# Patient Record
Sex: Male | Born: 1965 | Race: Black or African American | Hispanic: No | Marital: Married | Smoking: Former smoker
Health system: Southern US, Community
[De-identification: ages and names within clinical notes are randomized; demographics above are authoritative.]

## PROBLEM LIST (undated history)

## (undated) DIAGNOSIS — R319 Hematuria, unspecified: Secondary | ICD-10-CM

## (undated) DIAGNOSIS — T7840XA Allergy, unspecified, initial encounter: Secondary | ICD-10-CM

## (undated) DIAGNOSIS — J302 Other seasonal allergic rhinitis: Secondary | ICD-10-CM

## (undated) DIAGNOSIS — J45909 Unspecified asthma, uncomplicated: Secondary | ICD-10-CM

## (undated) HISTORY — PX: WISDOM TOOTH EXTRACTION: SHX21

## (undated) HISTORY — PX: TONSILLECTOMY: SUR1361

## (undated) HISTORY — PX: NASAL SEPTUM SURGERY: SHX37

## (undated) HISTORY — DX: Allergy, unspecified, initial encounter: T78.40XA

## (undated) HISTORY — DX: Other seasonal allergic rhinitis: J30.2

## (undated) HISTORY — DX: Hematuria, unspecified: R31.9

## (undated) HISTORY — DX: Unspecified asthma, uncomplicated: J45.909

---

## 2012-03-25 ENCOUNTER — Ambulatory Visit (INDEPENDENT_AMBULATORY_CARE_PROVIDER_SITE_OTHER): Payer: BC Managed Care – PPO | Admitting: Internal Medicine

## 2012-03-25 VITALS — BP 134/77 | HR 64 | Temp 97.9°F | Resp 16 | Ht 68.18 in | Wt 186.4 lb

## 2012-03-25 DIAGNOSIS — N529 Male erectile dysfunction, unspecified: Secondary | ICD-10-CM

## 2012-03-25 DIAGNOSIS — R361 Hematospermia: Secondary | ICD-10-CM

## 2012-03-25 LAB — POCT CBC
Lymph, poc: 3.1 (ref 0.6–3.4)
MCH, POC: 30.9 pg (ref 27–31.2)
MCHC: 32 g/dL (ref 31.8–35.4)
MID (cbc): 0.7 (ref 0–0.9)
MPV: 8.2 fL (ref 0–99.8)
POC LYMPH PERCENT: 39.3 %L (ref 10–50)
POC MID %: 8.3 %M (ref 0–12)
Platelet Count, POC: 404 10*3/uL (ref 142–424)
WBC: 7.9 10*3/uL (ref 4.6–10.2)

## 2012-03-25 LAB — POCT URINALYSIS DIPSTICK
Blood, UA: NEGATIVE
Nitrite, UA: NEGATIVE
Urobilinogen, UA: 0.2
pH, UA: 6

## 2012-03-25 LAB — POCT UA - MICROSCOPIC ONLY
Casts, Ur, LPF, POC: NEGATIVE
Crystals, Ur, HPF, POC: NEGATIVE
Epithelial cells, urine per micros: NEGATIVE

## 2012-03-25 NOTE — Progress Notes (Signed)
  Subjective:    Patient ID: Michael Lewis, male    DOB: [birth year], 46 y.o.   MRN: [id number]  HPI wife noticed blood in semen after intercourse this weekend  He denies dysuria, discharge, testicular pain, urinary hesitation, incomplete voiding, history of prostate problems, urinary frequency, or nocturia/wife with no gyn complaints-neither w/ outside partners  He has noticed 12-18 months history of poor erectile function-erratic-not everytime and no definite associations/admits busy sched-success w/P  Past medical history-no illnesses medications or surgery  Review of Systems No weight loss /no fatigue or change in daily activities No fever chills or night sweats  No headaches or vision trouble  No chest pain or palpitations  No shortness of breath     Objective:   Physical Exam Vital signs stable No thyromegaly or cervical adenopathy Heart regular Testicles without masses or tenderness and no atrophy noted penis intact without lesions Prostate soft and symmetrical without nodules      Results for orders placed in visit on 03/25/12  POCT CBC      Component Value Range   WBC 7.9  4.6 - 10.2 K/uL   Lymph, poc 3.1  0.6 - 3.4   POC LYMPH PERCENT 39.3  10 - 50 %L   MID (cbc) 0.7  0 - 0.9   POC MID % 8.3  0 - 12 %M   POC Granulocyte 4.1  2 - 6.9   Granulocyte percent 52.4  37 - 80 %G   RBC 4.89  4.69 - 6.13 M/uL   Hemoglobin 15.1  14.1 - 18.1 g/dL   HCT, POC 40.9  81.1 - 53.7 %   MCV 96.6  80 - 97 fL   MCH, POC 30.9  27 - 31.2 pg   MCHC 32.0  31.8 - 35.4 g/dL   RDW, POC 91.4     Platelet Count, POC 404  142 - 424 K/uL   MPV 8.2  0 - 99.8 fL  POCT URINALYSIS DIPSTICK      Component Value Range   Color, UA yellow     Clarity, UA clear     Glucose, UA neg     Bilirubin, UA neg     Ketones, UA trace     Spec Grav, UA >=1.030     Blood, UA neg     pH, UA 6.0     Protein, UA neg     Urobilinogen, UA 0.2     Nitrite, UA neg     Leukocytes, UA Negative    POCT UA -  MICROSCOPIC ONLY      Component Value Range   WBC, Ur, HPF, POC 0-3     RBC, urine, microscopic neg     Bacteria, U Microscopic trace     Mucus, UA trace     Epithelial cells, urine per micros neg     Crystals, Ur, HPF, POC neg     Casts, Ur, LPF, POC neg     Yeast, UA neg      Assessment and plan  Problem #1 hematospermia  Problem #2 erectile dysfunction   Routine labs to include URiprobe and testosterone /TSH  If hematospermia continues with no etiology then urological evaluation with cystoscopy will be necessary  We'll notify lab results and plan

## 2012-03-26 LAB — COMPREHENSIVE METABOLIC PANEL
ALT: 19 U/L (ref 0–53)
BUN: 14 mg/dL (ref 6–23)
CO2: 23 mEq/L (ref 19–32)
Creat: 1.02 mg/dL (ref 0.50–1.35)
Total Bilirubin: 0.5 mg/dL (ref 0.3–1.2)

## 2012-03-26 LAB — TSH: TSH: 2.31 u[IU]/mL (ref 0.350–4.500)

## 2012-03-27 LAB — GC/CHLAMYDIA PROBE AMP, URINE
Chlamydia, Swab/Urine, PCR: NEGATIVE
GC Probe Amp, Urine: NEGATIVE

## 2012-03-27 LAB — TESTOSTERONE, FREE, TOTAL, SHBG
Sex Hormone Binding: 29 nmol/L (ref 13–71)
Testosterone, Free: 49.1 pg/mL (ref 47.0–244.0)
Testosterone-% Free: 2.1 % (ref 1.6–2.9)

## 2012-03-29 ENCOUNTER — Encounter: Payer: Self-pay | Admitting: Internal Medicine

## 2012-04-01 ENCOUNTER — Telehealth: Payer: Self-pay

## 2012-04-01 NOTE — Telephone Encounter (Signed)
MELANIE STATES WE TOLD HER HUSBAND WAS TOLD WE WOULD CALL HIM BACK IN 48HRS. REGARDING HIS LABS AND WE WERE ALSO GOING TO MAIL A COPY TO HIS HOME PLEASE CALL [phone]

## 2012-04-01 NOTE — Telephone Encounter (Signed)
Please advise on labs, Dr Merla Riches out of office, testosterone is low, others okay, what should I advise patient?  Note from Dr Merla Riches Routine labs to include URiprobe and testosterone /TSH  If hematospermia continues with no etiology then urological evaluation with cystoscopy will be necessary  We'll notify lab results and plan

## 2012-04-01 NOTE — Telephone Encounter (Signed)
Dr. Merla Riches will be back on Thursday.  I think it is ok if we wait and let him make this decision - unless the patient would like [place] to do a referral to urology (we will definitely want to do referral if he is continuing to have blood in his semen)

## 2012-04-04 ENCOUNTER — Telehealth: Payer: Self-pay

## 2012-04-04 NOTE — Telephone Encounter (Signed)
Patient's wife just received lab results for her husband in the mail and would like someone to call her and go over them with her.  Best # [phone]

## 2012-04-04 NOTE — Telephone Encounter (Signed)
Letter sent 12/20 with all test results No cause of hematospermia identified--if it happens again he needs urological evaluation Also testosterone was low--this also would be a reason to refer him to urology or f/u to consider other options

## 2012-04-04 NOTE — Telephone Encounter (Signed)
PT'S WIFE CALLED AGAIN.  SAYS THEY STILL HAVE NOT RECEIVED THE RESULTS FROM [facility].  ADVISED THE PATIENT HE WOULD BE BACK THIS EVENING.  PLEASE CALL

## 2012-04-05 ENCOUNTER — Ambulatory Visit (INDEPENDENT_AMBULATORY_CARE_PROVIDER_SITE_OTHER): Payer: BC Managed Care – PPO | Admitting: Emergency Medicine

## 2012-04-05 VITALS — BP 113/71 | HR 60 | Temp 98.0°F | Resp 16 | Ht 69.5 in | Wt 182.2 lb

## 2012-04-05 DIAGNOSIS — J209 Acute bronchitis, unspecified: Secondary | ICD-10-CM

## 2012-04-05 DIAGNOSIS — R05 Cough: Secondary | ICD-10-CM

## 2012-04-05 DIAGNOSIS — R059 Cough, unspecified: Secondary | ICD-10-CM

## 2012-04-05 MED ORDER — HYDROCOD POLST-CHLORPHEN POLST 10-8 MG/5ML PO LQCR: 5.0000 mL | Freq: Two times a day (BID) | ORAL | Status: DC | PRN

## 2012-04-05 MED ORDER — AZITHROMYCIN 250 MG PO TABS: ORAL_TABLET | ORAL | Status: DC

## 2012-04-05 NOTE — Telephone Encounter (Signed)
I spoke to his wife, she states he is coming in today for this.

## 2012-04-05 NOTE — Telephone Encounter (Signed)
LMOM for wife to CB. Pt was in to see Dr Dareen Piano for bronchitis this morning and may have had his ?s answered.

## 2012-04-05 NOTE — Progress Notes (Signed)
[facility] [address] [place] [address] [phone]  Date:  04/05/2012   Name:  Michael Lewis   DOB:  06-04-1965   MRN:  [id number]  PCP:  No primary provider on file.    Chief Complaint: Cough and Headache   History of Present Illness:  Michael Lewis is a 45 y.o. very pleasant male patient who presents with the following:  Ill since Monday with fever and chills.  Has a cough that is not productive with some wheezing.  History of asthma treated with MDI as a child.  No shortness of breath, nausea or vomiting.  No stool change.  No improvement with OTC meds. Had a flu shot.  Reformed smoker.  Has a sore throat.  No nasal congestion or drainage.  There is no problem list on file for this patient.   Past Medical History  Diagnosis Date  . Asthma     Past Surgical History  Procedure Date  . Nasal septum surgery     History  Substance Use Topics  . Smoking status: Never Smoker   . Smokeless tobacco: Not on file  . Alcohol Use: No    Family History  Problem Relation Age of Onset  . Alzheimer's disease Maternal Grandfather   . Arthritis Paternal Grandmother     No Known Allergies  Medication list has been reviewed and updated.  No current outpatient prescriptions on file prior to visit.    Review of Systems:  As per HPI, otherwise negative.    Physical Examination: Filed Vitals:   04/05/12 0906  BP: 113/71  Pulse: 60  Temp: 98 F (36.7 C)  Resp: 16   Filed Vitals:   04/05/12 0906  Height: 5' 9.5" (1.765 m)  Weight: 182 lb 3.2 oz (82.645 kg)   Body mass index is 26.52 kg/(m^2). Ideal Body Weight: Weight in (lb) to have BMI = 25: 171.4   GEN: WDWN, NAD, Non-toxic, A & O x 3 HEENT: Atraumatic, Normocephalic. Neck supple. No masses, No LAD. Ears and Nose: No external deformity. CV: RRR, No M/G/R. No JVD. No thrill. No extra heart sounds. PULM: CTA B, no wheezes, crackles, rhonchi. No retractions. No resp. distress. No  accessory muscle use. ABD: S, NT, ND, +BS. No rebound. No HSM. EXTR: No c/c/e NEURO Normal gait.  PSYCH: Normally interactive. Conversant. Not depressed or anxious appearing.  Calm demeanor.    Assessment and Plan: Acute bronchitis zpak tussionex Follow up as needed   Carmelina Dane, MD  Results for orders placed in visit on 04/05/12  POCT INFLUENZA A/B      Component Value Range   Influenza A, POC Negative     Influenza B, POC Negative

## 2012-04-05 NOTE — Patient Instructions (Addendum)

## 2012-04-06 NOTE — Telephone Encounter (Signed)
Patient has been seen in office

## 2013-04-04 ENCOUNTER — Telehealth: Payer: Self-pay

## 2013-04-04 NOTE — Telephone Encounter (Signed)
Incorrect contact information 

## 2013-04-08 ENCOUNTER — Encounter: Payer: Self-pay | Admitting: Family Medicine

## 2013-04-08 ENCOUNTER — Ambulatory Visit (INDEPENDENT_AMBULATORY_CARE_PROVIDER_SITE_OTHER): Payer: BC Managed Care – PPO | Admitting: Family Medicine

## 2013-04-08 VITALS — BP 118/72 | HR 64 | Temp 98.1°F | Ht 69.0 in | Wt 184.0 lb

## 2013-04-08 DIAGNOSIS — J45909 Unspecified asthma, uncomplicated: Secondary | ICD-10-CM

## 2013-04-08 DIAGNOSIS — Z0000 Encounter for general adult medical examination without abnormal findings: Secondary | ICD-10-CM

## 2013-04-08 DIAGNOSIS — Z23 Encounter for immunization: Secondary | ICD-10-CM

## 2013-04-08 LAB — HEPATIC FUNCTION PANEL
AST: 27 U/L (ref 0–37)
Albumin: 4 g/dL (ref 3.5–5.2)
Alkaline Phosphatase: 62 U/L (ref 39–117)
Bilirubin, Direct: 0.1 mg/dL (ref 0.0–0.3)
Total Bilirubin: 1.1 mg/dL (ref 0.3–1.2)

## 2013-04-08 LAB — POCT URINALYSIS DIPSTICK
Ketones, UA: NEGATIVE
Leukocytes, UA: NEGATIVE
Protein, UA: NEGATIVE
Urobilinogen, UA: 0.2
pH, UA: 6

## 2013-04-08 LAB — CBC WITH DIFFERENTIAL/PLATELET
Basophils Absolute: 0 10*3/uL (ref 0.0–0.1)
Eosinophils Absolute: 0.2 10*3/uL (ref 0.0–0.7)
Lymphocytes Relative: 28.1 % (ref 12.0–46.0)
MCHC: 34 g/dL (ref 30.0–36.0)
Monocytes Relative: 10 % (ref 3.0–12.0)
Neutro Abs: 3.4 10*3/uL (ref 1.4–7.7)
Neutrophils Relative %: 57.4 % (ref 43.0–77.0)
Platelets: 320 10*3/uL (ref 150.0–400.0)
RDW: 13.1 % (ref 11.5–14.6)

## 2013-04-08 LAB — BASIC METABOLIC PANEL
CO2: 26 mEq/L (ref 19–32)
Calcium: 8.9 mg/dL (ref 8.4–10.5)
Creatinine, Ser: 1.1 mg/dL (ref 0.4–1.5)
GFR: 96.8 mL/min (ref >60.00)
Glucose, Bld: 105 mg/dL — ABNORMAL HIGH (ref 70–99)
Sodium: 140 mEq/L (ref 135–145)

## 2013-04-08 LAB — LIPID PANEL
HDL: 51.6 mg/dL (ref >39.00)
LDL Cholesterol: 125 mg/dL — ABNORMAL HIGH (ref 0–99)
VLDL: 23.2 mg/dL (ref 0.0–40.0)

## 2013-04-08 NOTE — Progress Notes (Signed)
Subjective:    Patient ID: Michael Lewis, male    DOB: 26-Apr-1965, 47 y.o.   MRN: [id number] HPI Pt here to establish and have cpe with labs.  No complaints.   Review of Systems Review of Systems  Constitutional: Negative for activity change, appetite change and fatigue.  HENT: Negative for hearing loss, congestion, tinnitus and ear discharge.  dentist q37m Eyes: Negative for visual disturbance (see optho q1y -- vision corrected to 20/20 with glasses).  Respiratory: Negative for cough, chest tightness and shortness of breath.   Cardiovascular: Negative for chest pain, palpitations and leg swelling.  Gastrointestinal: Negative for abdominal pain, diarrhea, constipation and abdominal distention.  Genitourinary: Negative for urgency, frequency, decreased urine volume and difficulty urinating.  Musculoskeletal: Negative for back pain, arthralgias and gait problem.  Skin: Negative for color change, pallor and rash.  Neurological: Negative for dizziness, light-headedness, numbness and headaches.  Hematological: Negative for adenopathy. Does not bruise/bleed easily.  Psychiatric/Behavioral: Negative for suicidal ideas, confusion, sleep disturbance, self-injury, dysphoric mood, decreased concentration and agitation.   Past Medical History  Diagnosis Date  . Asthma    History   Social History  . Marital Status: Married    Spouse Name: N/A    Number of Children: N/A  . Years of Education: N/A   Occupational History  . Not on file.   Social History Main Topics  . Smoking status: Former Games developer  . Smokeless tobacco: Never Used  . Alcohol Use: Yes     Comment: Occ  . Drug Use: No  . Sexual Activity: Yes   Other Topics Concern  . Not on file   Social History Narrative  . No narrative on file   Family History  Problem Relation Age of Onset  . Alzheimer's disease Maternal Grandfather   . Arthritis Paternal Grandmother   . Stroke Maternal Aunt     MGA  . Heart disease  Maternal Grandmother   . COPD Father          Objective:   Physical Exam  BP 118/72  Pulse 64  Temp(Src) 98.1 F (36.7 C) (Oral)  Ht 5\' 9"  (1.753 m)  Wt 184 lb (83.462 kg)  BMI 27.16 kg/m2  SpO2 98% General appearance: alert, cooperative, appears stated age and no distress Head: Normocephalic, without obvious abnormality, atraumatic Eyes: negative findings: lids and lashes normal and pupils equal, round, reactive to light and accomodation Ears: normal TM's and external ear canals both ears Nose: Nares normal. Septum midline. Mucosa normal. No drainage or sinus tenderness. Throat: lips, mucosa, and tongue normal; teeth and gums normal Neck: no adenopathy, no carotid bruit, no JVD, supple, symmetrical, trachea midline and thyroid not enlarged, symmetric, no tenderness/mass/nodules Back: symmetric, no curvature. ROM normal. No CVA tenderness. Lungs: clear to auscultation bilaterally Chest wall: no tenderness Heart: S1, S2 normal Abdomen: soft, non-tender; bowel sounds normal; no masses,  no organomegaly Male genitalia: normal, penis: no lesions or discharge. testes: no masses or tenderness. no hernias Rectal: normal tone, normal prostate, no masses or tenderness and soft brown guaiac negative stool noted Extremities: extremities normal, atraumatic, no cyanosis or edema Pulses: 2+ and symmetric Skin: Skin color, texture, turgor normal. No rashes or lesions Lymph nodes: Cervical, supraclavicular, and axillary nodes normal. Neurologic: Alert and oriented X 3, normal strength and tone. Normal symmetric reflexes. Normal coordination and gait Psych-- no depression, no anxiety      Assessment & Plan:   cpe--  ghm utd  Check fasting labs             rto 1 year or sooner prn

## 2013-04-08 NOTE — Patient Instructions (Signed)
 Preventive Care for Adults, Male A healthy lifestyle and preventive care can promote health and wellness. Preventive health guidelines for men include the following key practices:  A routine yearly physical is a good way to check with your caregiver about your health and preventative screening. It is a chance to share any concerns and updates on your health, and to receive a thorough exam.  Visit your dentist for a routine exam and preventative care every 6 months. Brush your teeth twice a day and floss once a day. Good oral hygiene prevents tooth decay and gum disease.  The frequency of eye exams is based on your age, health, family medical history, use of contact lenses, and other factors. Follow your caregiver's recommendations for frequency of eye exams.  Eat a healthy diet. Foods like vegetables, fruits, whole grains, low-fat dairy products, and lean protein foods contain the nutrients you need without too many calories. Decrease your intake of foods high in solid fats, added sugars, and salt. Eat the right amount of calories for you.Get information about a proper diet from your caregiver, if necessary.  Regular physical exercise is one of the most important things you can do for your health. Most adults should get at least 150 minutes of moderate-intensity exercise (any activity that increases your heart rate and causes you to sweat) each week. In addition, most adults need muscle-strengthening exercises on 2 or more days a week.  Maintain a healthy weight. The body mass index (BMI) is a screening tool to identify possible weight problems. It provides an estimate of body fat based on height and weight. Your caregiver can help determine your BMI, and can help you achieve or maintain a healthy weight.For adults 20 years and older:  A BMI below 18.5 is considered underweight.  A BMI of 18.5 to 24.9 is normal.  A BMI of 25 to 29.9 is considered overweight.  A BMI of 30 and above is  considered obese.  Maintain normal blood lipids and cholesterol levels by exercising and minimizing your intake of saturated fat. Eat a balanced diet with plenty of fruit and vegetables. Blood tests for lipids and cholesterol should begin at age 20 and be repeated every 5 years. If your lipid or cholesterol levels are high, you are over 50, or you are a high risk for heart disease, you may need your cholesterol levels checked more frequently.Ongoing high lipid and cholesterol levels should be treated with medicines if diet and exercise are not effective.  If you smoke, find out from your caregiver how to quit. If you do not use tobacco, do not start.  Lung cancer screening is recommended for adults aged 55 80 years who are at high risk for developing lung cancer because of a history of smoking. Yearly low-dose computed tomography (CT) is recommended for people who have at least a 30-pack-year history of smoking and are a current smoker or have quit within the past 15 years. A pack year of smoking is smoking an average of 1 pack of cigarettes a day for 1 year (for example: 1 pack a day for 30 years or 2 packs a day for 15 years). Yearly screening should continue until the smoker has stopped smoking for at least 15 years. Yearly screening should also be stopped for people who develop a health problem that would prevent them from having lung cancer treatment.  If you choose to drink alcohol, do not exceed 2 drinks per day. One drink is considered to be 12   ounces (355 mL) of beer, 5 ounces (148 mL) of wine, or 1.5 ounces (44 mL) of liquor.  Avoid use of street drugs. Do not share needles with anyone. Ask for help if you need support or instructions about stopping the use of drugs.  High blood pressure causes heart disease and increases the risk of stroke. Your blood pressure should be checked at least every 1 to 2 years. Ongoing high blood pressure should be treated with medicines, if weight loss and  exercise are not effective.  If you are 45 to 45 years old, ask your caregiver if you should take aspirin to prevent heart disease.  Diabetes screening involves taking a blood sample to check your fasting blood sugar level. This should be done once every 3 years, after age 45, if you are within normal weight and without risk factors for diabetes. Testing should be considered at a younger age or be carried out more frequently if you are overweight and have at least 1 risk factor for diabetes.  Colorectal cancer can be detected and often prevented. Most routine colorectal cancer screening begins at the age of 50 and continues through age 75. However, your caregiver may recommend screening at an earlier age if you have risk factors for colon cancer. On a yearly basis, your caregiver may provide home test kits to check for hidden blood in the stool. Use of a small camera at the end of a tube, to directly examine the colon (sigmoidoscopy or colonoscopy), can detect the earliest forms of colorectal cancer. Talk to your caregiver about this at age 50, when routine screening begins. Direct examination of the colon should be repeated every 5 to 10 years through age 75, unless early forms of pre-cancerous polyps or small growths are found.  Hepatitis C blood testing is recommended for all people born from 1945 through 1965 and any individual with known risks for hepatitis C.  Practice safe sex. Use condoms and avoid high-risk sexual practices to reduce the spread of sexually transmitted infections (STIs). STIs include gonorrhea, chlamydia, syphilis, trichomonas, herpes, HPV, and human immunodeficiency virus (HIV). Herpes, HIV, and HPV are viral illnesses that have no cure. They can result in disability, cancer, and death.  A one-time screening for abdominal aortic aneurysm (AAA) and surgical repair of large AAAs by sound wave imaging (ultrasonography) is recommended for ages 65 to 75 years who are current or  former smokers.  Healthy men should no longer receive prostate-specific antigen (PSA) blood tests as part of routine cancer screening. Consult with your caregiver about prostate cancer screening.  Testicular cancer screening is not recommended for adult males who have no symptoms. Screening includes self-exam, caregiver exam, and other screening tests. Consult with your caregiver about any symptoms you have or any concerns you have about testicular cancer.  Use sunscreen. Apply sunscreen liberally and repeatedly throughout the day. You should seek shade when your shadow is shorter than you. Protect yourself by wearing long sleeves, pants, a wide-brimmed hat, and sunglasses year round, whenever you are outdoors.  Once a month, do a whole body skin exam, using a mirror to look at the skin on your back. Notify your caregiver of new moles, moles that have irregular borders, moles that are larger than a pencil eraser, or moles that have changed in shape or color.  Stay current with required immunizations.  Influenza vaccine. All adults should be immunized every year.  Tetanus, diphtheria, and acellular pertussis (Td, Tdap) vaccine. An adult who has not   previously received Tdap or who does not know his vaccine status should receive 1 dose of Tdap. This initial dose should be followed by tetanus and diphtheria toxoids (Td) booster doses every 10 years. Adults with an unknown or incomplete history of completing a 3-dose immunization series with Td-containing vaccines should begin or complete a primary immunization series including a Tdap dose. Adults should receive a Td booster every 10 years.  Varicella vaccine. An adult without evidence of immunity to varicella should receive 2 doses or a second dose if he has previously received 1 dose.  Human papillomavirus (HPV) vaccine. Males aged 13 21 years who have not received the vaccine previously should receive the 3-dose series. Males aged 22 26 years may be  immunized. Immunization is recommended through the age of 26 years for any male who has sex with males and did not get any or all doses earlier. Immunization is recommended for any person with an immunocompromised condition through the age of 26 years if he did not get any or all doses earlier. During the 3-dose series, the second dose should be obtained 4 8 weeks after the first dose. The third dose should be obtained 24 weeks after the first dose and 16 weeks after the second dose.  Zoster vaccine. One dose is recommended for adults aged 60 years or older unless certain conditions are present.  Measles, mumps, and rubella (MMR) vaccine. Adults born before 1957 generally are considered immune to measles and mumps. Adults born in 1957 or later should have 1 or more doses of MMR vaccine unless there is a contraindication to the vaccine or there is laboratory evidence of immunity to each of the three diseases. A routine second dose of MMR vaccine should be obtained at least 28 days after the first dose for students attending postsecondary schools, health care workers, or international travelers. People who received inactivated measles vaccine or an unknown type of measles vaccine during 1963 1967 should receive 2 doses of MMR vaccine. People who received inactivated mumps vaccine or an unknown type of mumps vaccine before 1979 and are at high risk for mumps infection should consider immunization with 2 doses of MMR vaccine. Unvaccinated health care workers born before 1957 who lack laboratory evidence of measles, mumps, or rubella immunity or laboratory confirmation of disease should consider measles and mumps immunization with 2 doses of MMR vaccine or rubella immunization with 1 dose of MMR vaccine.  Pneumococcal 13-valent conjugate (PCV13) vaccine. When indicated, a person who is uncertain of his immunization history and has no record of immunization should receive the PCV13 vaccine. An adult aged 19 years or  older who has certain medical conditions and has not been previously immunized should receive 1 dose of PCV13 vaccine. This PCV13 should be followed with a dose of pneumococcal polysaccharide (PPSV23) vaccine. The PPSV23 vaccine dose should be obtained at least 8 weeks after the dose of PCV13 vaccine. An adult aged 19 years or older who has certain medical conditions and previously received 1 or more doses of PPSV23 vaccine should receive 1 dose of PCV13. The PCV13 vaccine dose should be obtained 1 or more years after the last PPSV23 vaccine dose.  Pneumococcal polysaccharide (PPSV23) vaccine. When PCV13 is also indicated, PCV13 should be obtained first. All adults aged 65 years and older should be immunized. An adult younger than age 65 years who has certain medical conditions should be immunized. Any person who resides in a nursing home or long-term care facility should be   immunized. An adult smoker should be immunized. People with an immunocompromised condition and certain other conditions should receive both PCV13 and PPSV23 vaccines. People with human immunodeficiency virus (HIV) infection should be immunized as soon as possible after diagnosis. Immunization during chemotherapy or radiation therapy should be avoided. Routine use of PPSV23 vaccine is not recommended for American Indians, [place] Natives, or people younger than 65 years unless there are medical conditions that require PPSV23 vaccine. When indicated, people who have unknown immunization and have no record of immunization should receive PPSV23 vaccine. One-time revaccination 5 years after the first dose of PPSV23 is recommended for people aged 19 64 years who have chronic kidney failure, nephrotic syndrome, asplenia, or immunocompromised conditions. People who received 1 2 doses of PPSV23 before age 65 years should receive another dose of PPSV23 vaccine at age 65 years or later if at least 5 years have passed since the previous dose. Doses of  PPSV23 are not needed for people immunized with PPSV23 at or after age 65 years.  Meningococcal vaccine. Adults with asplenia or persistent complement component deficiencies should receive 2 doses of quadrivalent meningococcal conjugate (MenACWY-D) vaccine. The doses should be obtained at least 2 months apart. Microbiologists working with certain meningococcal bacteria, military recruits, people at risk during an outbreak, and people who travel to or live in countries with a high rate of meningitis should be immunized. A first-year college student up through age 21 years who is living in a residence hall should receive a dose if he did not receive a dose on or after his 16th birthday. Adults who have certain high-risk conditions should receive one or more doses of vaccine.  Hepatitis A vaccine. Adults who wish to be protected from this disease, have certain high-risk conditions, work with hepatitis A-infected animals, work in hepatitis A research labs, or travel to or work in countries with a high rate of hepatitis A should be immunized. Adults who were previously unvaccinated and who anticipate close contact with an international adoptee during the first 60 days after arrival in the [place] from a country with a high rate of hepatitis A should be immunized.  Hepatitis B vaccine. Adults who wish to be protected from this disease, have certain high-risk conditions, may be exposed to blood or other infectious body fluids, are household contacts or sex partners of hepatitis B positive people, are clients or workers in certain care facilities, or travel to or work in countries with a high rate of hepatitis B should be immunized.  Haemophilus influenzae type b (Hib) vaccine. A previously unvaccinated person with asplenia or sickle cell disease or having a scheduled splenectomy should receive 1 dose of Hib vaccine. Regardless of previous immunization, a recipient of a hematopoietic stem cell transplant  should receive a 3-dose series 6 12 months after his successful transplant. Hib vaccine is not recommended for adults with HIV infection. Preventive Service / Frequency Ages 19 to 39  Blood pressure check.** / Every 1 to 2 years.  Lipid and cholesterol check.** / Every 5 years beginning at age 20.  Hepatitis C blood test.** / For any individual with known risks for hepatitis C.  Skin self-exam. / Monthly.  Influenza vaccine. / Every year.  Tetanus, diphtheria, and acellular pertussis (Tdap, Td) vaccine.** / Consult your caregiver. 1 dose of Td every 10 years.  Varicella vaccine.** / Consult your caregiver.  HPV vaccine. / 3 doses over 6 months, if 26 and younger.  Measles, mumps, rubella (MMR) vaccine.** /   You need at least 1 dose of MMR if you were born in 1957 or later. You may also need a 2nd dose.  Pneumococcal 13-valent conjugate (PCV13) vaccine.** / Consult your caregiver.  Pneumococcal polysaccharide (PPSV23) vaccine.** / 1 to 2 doses if you smoke cigarettes or if you have certain conditions.  Meningococcal vaccine.** / 1 dose if you are age 19 to 21 years and a first-year college student living in a residence hall, or have one of several medical conditions, you need to get vaccinated against meningococcal disease. You may also need additional booster doses.  Hepatitis A vaccine.** / Consult your caregiver.  Hepatitis B vaccine.** / Consult your caregiver.  Haemophilus influenzae type b (Hib) vaccine.** / Consult your caregiver. Ages 40 to 64  Blood pressure check.** / Every 1 to 2 years.  Lipid and cholesterol check.** / Every 5 years beginning at age 20.  Lung cancer screening. / Every year if you are aged 55 80 years and have a 30-pack-year history of smoking and currently smoke or have quit within the past 15 years. Yearly screening is stopped once you have quit smoking for at least 15 years or develop a health problem that would prevent you from having lung cancer  treatment.  Fecal occult blood test (FOBT) of stool. / Every year beginning at age 50 and continuing until age 75. You may not have to do this test if you get colonoscopy every 10 years.  Flexible sigmoidoscopy** or colonoscopy.** / Every 5 years for a flexible sigmoidoscopy or every 10 years for a colonoscopy beginning at age 50 and continuing until age 75.  Hepatitis C blood test.** / For all people born from 1945 through 1965 and any individual with known risks for hepatitis C.  Skin self-exam. / Monthly.  Influenza vaccine. / Every year.  Tetanus, diphtheria, and acellular pertussis (Tdap/Td) vaccine.** / Consult your caregiver. 1 dose of Td every 10 years.  Varicella vaccine.** / Consult your caregiver.  Zoster vaccine.** / 1 dose for adults aged 60 years or older.  Measles, mumps, rubella (MMR) vaccine.** / You need at least 1 dose of MMR if you were born in 1957 or later. You may also need a 2nd dose.  Pneumococcal 13-valent conjugate (PCV13) vaccine.** / Consult your caregiver.  Pneumococcal polysaccharide (PPSV23) vaccine.** / 1 to 2 doses if you smoke cigarettes or if you have certain conditions.  Meningococcal vaccine.** / Consult your caregiver.  Hepatitis A vaccine.** / Consult your caregiver.  Hepatitis B vaccine.** / Consult your caregiver.  Haemophilus influenzae type b (Hib) vaccine.** / Consult your caregiver. Ages 65 and over  Blood pressure check.** / Every 1 to 2 years.  Lipid and cholesterol check.**/ Every 5 years beginning at age 20.  Lung cancer screening. / Every year if you are aged 55 80 years and have a 30-pack-year history of smoking and currently smoke or have quit within the past 15 years. Yearly screening is stopped once you have quit smoking for at least 15 years or develop a health problem that would prevent you from having lung cancer treatment.  Fecal occult blood test (FOBT) of stool. / Every year beginning at age 50 and continuing until  age 75. You may not have to do this test if you get colonoscopy every 10 years.  Flexible sigmoidoscopy** or colonoscopy.** / Every 5 years for a flexible sigmoidoscopy or every 10 years for a colonoscopy beginning at age 50 and continuing until age 75.  Hepatitis C blood   test.** / For all people born from 1945 through 1965 and any individual with known risks for hepatitis C.  Abdominal aortic aneurysm (AAA) screening.** / A one-time screening for ages 65 to 75 years who are current or former smokers.  Skin self-exam. / Monthly.  Influenza vaccine. / Every year.  Tetanus, diphtheria, and acellular pertussis (Tdap/Td) vaccine.** / 1 dose of Td every 10 years.  Varicella vaccine.** / Consult your caregiver.  Zoster vaccine.** / 1 dose for adults aged 60 years or older.  Pneumococcal 13-valent conjugate (PCV13) vaccine.** / Consult your caregiver.  Pneumococcal polysaccharide (PPSV23) vaccine.** / 1 dose for all adults aged 65 years and older.  Meningococcal vaccine.** / Consult your caregiver.  Hepatitis A vaccine.** / Consult your caregiver.  Hepatitis B vaccine.** / Consult your caregiver.  Haemophilus influenzae type b (Hib) vaccine.** / Consult your caregiver. **Family history and personal history of risk and conditions may change your caregiver's recommendations. Document Released: 05/23/2001 Document Revised: 07/22/2012 Document Reviewed: 08/22/2010 [employer] Patient Information 2014 [employer].  

## 2013-04-08 NOTE — Progress Notes (Signed)
Pre visit review using our clinic review tool, if applicable. No additional management support is needed unless otherwise documented below in the visit note. 

## 2013-11-25 ENCOUNTER — Ambulatory Visit: Payer: BC Managed Care – PPO | Admitting: Medical

## 2013-11-26 ENCOUNTER — Encounter: Payer: Self-pay | Admitting: Medical

## 2013-11-26 ENCOUNTER — Ambulatory Visit (INDEPENDENT_AMBULATORY_CARE_PROVIDER_SITE_OTHER): Payer: BC Managed Care – PPO | Admitting: Medical

## 2013-11-26 VITALS — BP 120/69 | HR 60 | Temp 98.2°F | Wt 192.0 lb

## 2013-11-26 DIAGNOSIS — R319 Hematuria, unspecified: Secondary | ICD-10-CM | POA: Insufficient documentation

## 2013-11-26 HISTORY — DX: Hematuria, unspecified: R31.9

## 2013-11-26 LAB — URINALYSIS, ROUTINE W REFLEX MICROSCOPIC
BILIRUBIN URINE: NEGATIVE
HGB URINE DIPSTICK: NEGATIVE
KETONES UR: NEGATIVE
LEUKOCYTES UA: NEGATIVE
Nitrite: NEGATIVE
RBC / HPF: NONE SEEN (ref 0–?)
SPECIFIC GRAVITY, URINE: 1.02 (ref 1.000–1.030)
Total Protein, Urine: NEGATIVE
URINE GLUCOSE: NEGATIVE
UROBILINOGEN UA: 0.2 (ref 0.0–1.0)
WBC, UA: NONE SEEN (ref 0–?)
pH: 6 (ref 5.0–8.0)

## 2013-11-26 LAB — POCT URINALYSIS DIPSTICK
BILIRUBIN UA: NEGATIVE
Glucose, UA: NEGATIVE
Ketones, UA: NEGATIVE
LEUKOCYTES UA: NEGATIVE
NITRITE UA: NEGATIVE
PH UA: 6
Spec Grav, UA: 1.015
UROBILINOGEN UA: NEGATIVE

## 2013-11-26 NOTE — Progress Notes (Signed)
Pre visit review using our clinic review tool, if applicable. No additional management support is needed unless otherwise documented below in the visit note. 

## 2013-11-26 NOTE — Progress Notes (Signed)
Subjective:    Patient ID: Michael Lewis, male    DOB: Oct 02, 1965, 48 y.o.   MRN: [id number]  HPI  Pt states his wife thought she saw some blood in her urine. Pt states this was on Sunday. Pt differs and states there was none. Pt used to smoke. Smoked for about 20 yrs. Stopped 5 yrs ago. When he did smoke was about pack a day.On review no fevers,no chills, no back pain, no testicle pain, no scrotum pain, no penis discharge. No hx of blood in urine. No family history of bladder cancer. No family history of prostate cancer.   Past Medical History  Diagnosis Date  . Asthma     History   Social History  . Marital Status: Married    Spouse Name: N/A    Number of Children: N/A  . Years of Education: N/A   Occupational History  . Not on file.   Social History Main Topics  . Smoking status: Former Smoker -- 1.00 packs/day for 25 years    Quit date: 01/08/2008  . Smokeless tobacco: Never Used  . Alcohol Use: Yes     Comment: Occ  . Drug Use: No  . Sexual Activity: Yes   Other Topics Concern  . Not on file   Social History Narrative  . No narrative on file    Past Surgical History  Procedure Laterality Date  . Nasal septum surgery    . Tonsillectomy      Family History  Problem Relation Age of Onset  . Alzheimer's disease Maternal Grandfather   . Arthritis Paternal Grandmother   . Stroke Maternal Aunt     MGA  . Heart disease Maternal Grandmother   . COPD Father     No Known Allergies  No current outpatient prescriptions on file prior to visit.   No current facility-administered medications on file prior to visit.    BP 120/69  Pulse 60  Temp(Src) 98.2 F (36.8 C)  Wt 192 lb (87.091 kg)  SpO2 95%     Review of Systems  Constitutional: Negative for fever, chills and fatigue.  Respiratory: Negative for cough, chest tightness and wheezing.   Cardiovascular: Negative for chest pain and palpitations.  Gastrointestinal: Negative for nausea, vomiting,  abdominal pain, diarrhea, constipation, blood in stool, abdominal distention and rectal pain.  Genitourinary: Negative for dysuria, urgency, frequency, hematuria, flank pain, scrotal swelling, penile pain and testicular pain.  Musculoskeletal: Negative for back pain and myalgias.  Hematological: Negative for adenopathy. Does not bruise/bleed easily.       Objective:   Physical Exam  Constitutional: He is oriented to person, place, and time. He appears well-developed and well-nourished. No distress.  Cardiovascular: Normal rate, regular rhythm and normal heart sounds.   Pulmonary/Chest: Effort normal and breath sounds normal. No respiratory distress. He has no wheezes. He has no rales. He exhibits no tenderness.  Abdominal: Soft. Bowel sounds are normal. He exhibits no distension and no mass. There is no tenderness. There is no rebound and no guarding.  Genitourinary: Rectum normal, prostate normal and penis normal. No penile tenderness.  No discharge from penis, no testicle tenderness. Prostate felt smooth and normal size. No nodules.  Neurological: He is alert and oriented to person, place, and time.  Skin: He is not diaphoretic.  Psychiatric: He has a normal mood and affect. His behavior is normal. Judgment and thought content normal.          Assessment & Plan:

## 2013-11-26 NOTE — Patient Instructions (Signed)
For your possible gross blood in urine viewed by wife, your trace blood in urine today and your history of smoking, I decided to refer you to urologist for further evaluation and possible cystoscopy. We are sending you urine for microscopy and will notify you of that result as well. Follow up as needed if recurrent blood in urine or urinary symptoms prior to urologist evaluation.

## 2013-11-26 NOTE — Assessment & Plan Note (Signed)
Wife seemed to describe gross blood in urine. Pt does differ and states he had none. Trace on urinalysis test in office. Pt has long history of smoking up until 5 yrs ago. Will send out for microscopy but will be caution based on smoking history and reported possible gross hematuria. Will go ahead and make urology referral.  No leukocytes or nitrates on urine test in office. So decided not to do culture.

## 2014-03-17 ENCOUNTER — Ambulatory Visit (INDEPENDENT_AMBULATORY_CARE_PROVIDER_SITE_OTHER): Payer: Self-pay | Admitting: Emergency Medicine

## 2014-03-17 VITALS — BP 130/80 | HR 62 | Temp 97.8°F | Resp 16 | Ht 68.75 in | Wt 187.6 lb

## 2014-03-17 DIAGNOSIS — Z0000 Encounter for general adult medical examination without abnormal findings: Secondary | ICD-10-CM

## 2014-03-17 NOTE — Progress Notes (Signed)
Subjective:  This chart was scribed for Michael Queen, MD by Donato Schultz, Medical Scribe. This patient was seen in Room 26 and the patient's care was started at 9:13 AM.   Patient ID: Michael Lewis, male    DOB: 1966/02/17, 48 y.o.   MRN: [id number]  HPI HPI Comments: Michael Lewis is a 48 y.o. male who presents to the [facility] for a DOT physical.  He does not have any health problems or take any medications currently.  He has never had any restrictions on his DOT.  He had a child history of asthma and does not use any inhaler currently.  He quit smoking 5 years ago.  Past Medical History  Diagnosis Date   Asthma    Past Surgical History  Procedure Laterality Date   Nasal septum surgery     Tonsillectomy     Family History  Problem Relation Age of Onset   Alzheimer's disease Maternal Grandfather    Arthritis Paternal Grandmother    Stroke Maternal Aunt     MGA   Heart disease Maternal Grandmother    COPD Father    History   Social History   Marital Status: Married    Spouse Name: N/A    Number of Children: N/A   Years of Education: N/A   Occupational History   Driver/Warehouse    Social History Main Topics   Smoking status: Former Smoker -- 1.00 packs/day for 25 years    Quit date: 01/08/2008   Smokeless tobacco: Never Used   Alcohol Use: No     Comment: Occ   Drug Use: No   Sexual Activity: Yes   Other Topics Concern   Not on file   Social History Narrative   Married. Education: [employer]. Exercise: No.   No Known Allergies  Review of Systems  All other systems reviewed and are negative.    Objective:  Physical Exam  Constitutional: He is oriented to person, place, and time. He appears well-developed and well-nourished.  HENT:  Head: Normocephalic and atraumatic.  Right Ear: Hearing, tympanic membrane, external ear and ear canal normal.  Left Ear: Hearing, tympanic membrane, external ear and ear canal  normal.  Nose: Nose normal.  Mouth/Throat: Oropharynx is clear and moist. No oropharyngeal exudate.  Eyes: Conjunctivae and EOM are normal. Pupils are equal, round, and reactive to light. No scleral icterus.  Neck: Normal range of motion. Neck supple. No thyromegaly present.  Cardiovascular: Normal rate, regular rhythm and normal heart sounds.   No murmur heard. Pulmonary/Chest: Effort normal and breath sounds normal. No respiratory distress. He has no wheezes. He has no rales.  Abdominal: Soft. Bowel sounds are normal. There is no tenderness.  Musculoskeletal: Normal range of motion.  Lymphadenopathy:    He has no cervical adenopathy.  Neurological: He is alert and oriented to person, place, and time.  Skin: Skin is warm and dry.  Psychiatric: He has a normal mood and affect. His behavior is normal.  Nursing note and vitals reviewed.  Results for orders placed or performed in visit on 11/26/13  Urinalysis, Routine w reflex microscopic  Result Value Ref Range   Color, Urine YELLOW Yellow;Lt. Yellow   APPearance CLEAR Clear   Specific Gravity, Urine 1.020 1.000-1.030   pH 6.0 5.0 - 8.0   Total Protein, Urine NEGATIVE Negative   Urine Glucose NEGATIVE Negative   Ketones, ur NEGATIVE Negative   Bilirubin Urine NEGATIVE Negative   Hgb urine  dipstick NEGATIVE Negative   Urobilinogen, UA 0.2 0.0 - 1.0   Leukocytes, UA NEGATIVE Negative   Nitrite NEGATIVE Negative   WBC, UA none seen 0-2/hpf   RBC / HPF none seen 0-2/hpf  POCT Urinalysis Dipstick  Result Value Ref Range   Color, UA yellow    Clarity, UA clear    Glucose, UA neg    Bilirubin, UA neg    Ketones, UA neg    Spec Grav, UA 1.015    Blood, UA trace    pH, UA 6.0    Protein, UA tract    Urobilinogen, UA negative    Nitrite, UA neg    Leukocytes, UA Negative     BP 130/80 mmHg   Pulse 62   Temp(Src) 97.8 F (36.6 C) (Oral)   Resp 16   Ht 5' 8.75" (1.746 m)   Wt 187 lb 9.6 oz (85.095 kg)   BMI 27.91 kg/m2   SpO2  99% Assessment & Plan:  Patient qualifies for a 2 year DOT pending his UA results.  He has a history of asthma as a child and quit smoking 5 years ago.  I personally performed the services described in this documentation, which was scribed in my presence. The recorded information has been reviewed and is accurate.

## 2014-03-17 NOTE — Progress Notes (Deleted)
   Subjective:    Patient ID: Michael Lewis, male    DOB: 1965/12/11, 48 y.o.   MRN: [id number]  HPI    Review of Systems  Constitutional: Negative.   HENT: Negative.   Eyes: Negative.   Respiratory: Negative.   Cardiovascular: Negative.   Gastrointestinal: Negative.   Endocrine: Negative.   Genitourinary: Negative.   Musculoskeletal: Negative.   Skin: Negative.   Allergic/Immunologic: Negative.   Neurological: Negative.   Hematological: Negative.   Psychiatric/Behavioral: Negative.        Objective:   Physical Exam        Assessment & Plan:

## 2016-01-03 DIAGNOSIS — Z23 Encounter for immunization: Secondary | ICD-10-CM | POA: Diagnosis not present

## 2016-01-09 DIAGNOSIS — H5213 Myopia, bilateral: Secondary | ICD-10-CM | POA: Diagnosis not present

## 2016-02-17 ENCOUNTER — Ambulatory Visit (INDEPENDENT_AMBULATORY_CARE_PROVIDER_SITE_OTHER): Payer: Self-pay | Admitting: Physician Assistant

## 2016-02-17 VITALS — BP 122/86 | HR 60 | Temp 97.8°F | Resp 17 | Ht 68.75 in | Wt 191.0 lb

## 2016-02-17 DIAGNOSIS — Z024 Encounter for examination for driving license: Secondary | ICD-10-CM

## 2016-02-17 NOTE — Patient Instructions (Signed)
     IF you received an x-ray today, you will receive an invoice from [facility]. Please contact  Radiology at [phone] with questions or concerns regarding your invoice.   IF you received labwork today, you will receive an invoice from [facility]. Please contact [facility] at [phone] with questions or concerns regarding your invoice.   Our billing staff will not be able to assist you with questions regarding bills from these companies.  You will be contacted with the lab results as soon as they are available. The fastest way to get your results is to activate your My Chart account. Instructions are located on the last page of this paperwork. If you have not heard from us regarding the results in 2 weeks, please contact this office.      

## 2016-02-17 NOTE — Progress Notes (Signed)
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  Medical History:  no  1. Head/Brain Injuries, disorders or illnesses no  2. Seizures, epilepsy no  3. Eye disorders or impaired vision (except corrective lenses) no  4. Ear disorders, loss of hearing or balance no  5. Heart disease or heart attack, other cardiovascular condition no  6. Heart surgery (valve replacement/bypass, angioplasty, pacemaker/defribrillator) no  7. High blood pressure no  8. High holesterol no  9. Chronic cough, shortness of breath or other breathing problems Asthma as child. No symptoms >5 years  10. Lung disease (emphysema, asthma or chronic bronchitis) no  11. Kidney disease, dialysis no  12. Digestive problems  no  13. Diabetes or elevated blood sugar  n/a  Insulin use no  14. Nervious or psychiatric disorders, e.g., severe depression no  15. Fainting or syncope no  16. Dizziness, headaches, numbness, tingling or memory loss no  17. Unexplained weight loss no  18. Stroke, TIA or paralysis no  19. Missing or impaired hand, arm, foot, leg, finger, toe no  20. Spinal injury or disease no  21. Bone, muscles or nerve problems no  22. Blood clots or bleeding bleeding disorders no  23. Cancer no  24. Chronic infection or other chronic diseases no  25. Sleep disorders, pauses in breathing while asleep, daytime sleepiness, loud snoring no  26. Have you ever had a sleep test? Remote surgery  27.  Have you ever spent a night in the hospital? no  28. Have you ever had a broken bone? Quit smoking 2009, 25 pack years  29. Have you or or do you use tobacco products? no  30. Regular, frequent alcohol use no  31. Illegal substance use within the past 2 years no  32.  Have you ever failed a drug test or been dependent on an illegal substance?  Current Medications: Prior to Admission medications   Not on File    Medical Examiner's Comments on Health History:  No risks to driving  TESTING:   Visual  Acuity Screening   Right eye Left eye Both eyes  Without correction:     With correction: 20/15 20/20 20/15   Comments: Peripheral Vision: Right eye 85 degrees. Left eye 85 degrees.The patient can\ distinguish the colors red, amber and green.  Hearing Screening Comments: The patient was able to hear a forced whisper from 10 feet.  Monocular Vision: No.  Hearing Aid used for test: No. Hearing Aid required to to meet standard: No.  BP 122/86 (BP Location: Right Arm, Patient Position: Sitting, Cuff Size: Normal)   Pulse 60   Temp 97.8 F (36.6 C) (Oral)   Resp 17   Ht 5' 8.75" (1.746 m)   Wt 191 lb (86.6 kg)   SpO2 98%   BMI 28.41 kg/m  Pulse rate is regular  Comments: UA: Pro-Neg, Glu-Neg, Blo-Neg, SG.- 1.025  PHYSICAL EXAMINATION:  1. Yes.   General Appearance: Marked overweight, tremor, signs of alcoholism, problem drinking or drug abuse. 2. Yes.   Skin Exam - tattoos, scars 3. Yes.   Eyes: pupillary equality, reaction to light, accommodation, ocular motility, ocular muscle imbalance, extra ocular movement, nystagmus, exopthalmos. Ask about retinopathy, cataracts, aphakia, glaucoma, macular degeneration and refer to a specialist if appropriate.  4. Yes.   Ears: Scarring of tympanic membrane, occlusion of external canal, perforated eardrums.     5. Yes.   Mouth and Throat: Irremedial deformities likely to interfere with breathing or swallowing.  6. Yes.   Heart: Murmurs, extra sounds, enlarged heart, pacemaker, implantable defibrillator.     7. Yes.   Lungs and Chest, not including breast examination: Abnormal Chest wall expansion, abnormal respiratory rate, abnormal breath sounds including wheezes or alveolar rates, impaired respiratory function, cyanosis. Abnormal findings on physical exam may require further testing such as pulmonary tests and/or x ray of chest.  8. Yes.   Abdomen and Viscera: Enlarged liver, enlarged spleen, masses, bruits, hernia, significant abdominal wall  muscle weakness.  9. Yes.   Genitourinary System: Hernia  10. Yes.   Spine, other musculoskeletal: Previous surgery, deformities, limitation of motion, tenderness. 11. Yes.   Extremities-Limb impaired: Loss or impairment of leg, foot, toe, arm, hand, finger. Perceptible limp, deformities, atrophy, weakness, paralysis, clubbing, edema, hypotonia. Insufficient grasp and prehension to maintain steering wheel grip. Insufficient mobility and strength in lower limb to operate pedals properly. 12. Yes.   Neurological: Impaired equilibrium, coordination or speech pattern; paresthesia, asymmetric deep tendon reflexes, sensory or positional abnormalities, abnormal patellar and Babinski's reflexes 13. Yes.   Gait - antalgic, ataxia  14. Yes.   Vascular System: Abnormal pulse and amplitude, carotid or arterial bruits, varicose veins.   Does not meet standards. Certification Status: does meet standards for 2 year certificate.  Wearing corrective lenses: no Wearing hearing aid: no Accompanied by a n/a waiver/exemption Skill performance Evaluation (SPE) Certificate: no Driving within an exempt intracity zone: yes Qualified by operation of 49 CFR [id number]: n/a  Certification expires 02/16/2018   Michael Lewis

## 2017-02-16 DIAGNOSIS — H5213 Myopia, bilateral: Secondary | ICD-10-CM | POA: Diagnosis not present

## 2017-10-29 ENCOUNTER — Encounter: Payer: Self-pay | Admitting: Medical

## 2017-10-29 ENCOUNTER — Ambulatory Visit (INDEPENDENT_AMBULATORY_CARE_PROVIDER_SITE_OTHER): Payer: BLUE CROSS/BLUE SHIELD | Admitting: Medical

## 2017-10-29 ENCOUNTER — Other Ambulatory Visit: Payer: Self-pay | Admitting: Medical

## 2017-10-29 ENCOUNTER — Ambulatory Visit (HOSPITAL_BASED_OUTPATIENT_CLINIC_OR_DEPARTMENT_OTHER)
Admission: RE | Admit: 2017-10-29 | Discharge: 2017-10-29 | Disposition: A | Discharge status: Home / Self Care | Payer: BLUE CROSS/BLUE SHIELD | Source: Ambulatory Visit | Attending: Medical | Admitting: Medical

## 2017-10-29 VITALS — BP 124/82 | HR 69 | Temp 98.5°F | Resp 16 | Ht 68.0 in | Wt 189.8 lb

## 2017-10-29 DIAGNOSIS — M1711 Unilateral primary osteoarthritis, right knee: Secondary | ICD-10-CM | POA: Diagnosis not present

## 2017-10-29 DIAGNOSIS — M79651 Pain in right thigh: Secondary | ICD-10-CM

## 2017-10-29 MED ORDER — DICLOFENAC SODIUM 75 MG PO TBEC: 75.0000 mg | DELAYED_RELEASE_TABLET | Freq: Two times a day (BID) | ORAL | 0 refills | Status: DC

## 2017-10-29 NOTE — Progress Notes (Signed)
Subjective:    Patient ID: Michael Lewis, male    DOB: 08/31/1965, 52 y.o.   MRN: [id number]  HPI  Pt in for some rt leg pain for the past 2 weeks.  He states when he stands up up or walks up step will have pain.   No history of fall or injury.   Pt states he first felt pain was when he stepped onto a ladder 2 weeks ago. Pain will occur about 4-5 times a day.   Pt not taking anything for pain. Pt took ibpofen and bc powder.(pt made aware not to [place] bc for pain). Explained potential GI effects.  Pt tells me he did see urologist in past for blood in urine. I had referred him in 2015. He states urologist did not find any cause for blood in his urine that time. Pt states no gross blood since visit with me. Pt states he actually never saw blood in urine but his wife saw blood in urine.    Review of Systems  Constitutional: Negative for appetite change, chills and fatigue.  HENT: Negative for congestion, drooling and ear pain.   Respiratory: Negative for apnea, cough, chest tightness, shortness of breath and wheezing.   Cardiovascular: Negative for chest pain and palpitations.  Gastrointestinal: Negative for abdominal distention and abdominal pain.  Musculoskeletal: Negative for back pain.       Medial aspect thigh pain. Mild tenderness to palpation.  Skin: Negative for rash.  Neurological: Negative for dizziness, speech difficulty, weakness, light-headedness and headaches.  Hematological: Negative for adenopathy. Does not bruise/bleed easily.  Psychiatric/Behavioral: Negative for agitation, behavioral problems and decreased concentration. The patient is not nervous/anxious.     Past Medical History:  Diagnosis Date  . Asthma   . Hematuria 11/26/2013     Social History   Socioeconomic History  . Marital status: Married    Spouse name: Not on file  . Number of children: Not on file  . Years of education: Not on file  . Highest education level: Not on file  Occupational  History  . Occupation: Driver/Warehouse  Social Needs  . Financial resource strain: Not on file  . Food insecurity:    Worry: Not on file    Inability: Not on file  . Transportation needs:    Medical: Not on file    Non-medical: Not on file  Tobacco Use  . Smoking status: Former Smoker    Packs/day: 1.00    Years: 25.00    Pack years: 25.00    Last attempt to quit: 01/08/2008    Years since quitting: 9.8  . Smokeless tobacco: Never Used  Substance and Sexual Activity  . Alcohol use: No    Alcohol/week: 0.0 oz    Comment: Occ  . Drug use: No  . Sexual activity: Yes  Lifestyle  . Physical activity:    Days per week: Not on file    Minutes per session: Not on file  . Stress: Not on file  Relationships  . Social connections:    Talks on phone: Not on file    Gets together: Not on file    Attends religious service: Not on file    Active member of club or organization: Not on file    Attends meetings of clubs or organizations: Not on file    Relationship status: Not on file  . Intimate partner violence:    Fear of current or ex partner: Not on file    Emotionally  abused: Not on file    Physically abused: Not on file    Forced sexual activity: Not on file  Other Topics Concern  . Not on file  Social History Narrative   Married. Education: [employer]. Exercise: No.    Past Surgical History:  Procedure Laterality Date  . NASAL SEPTUM SURGERY    . TONSILLECTOMY      Family History  Problem Relation Age of Onset  . Alzheimer's disease Maternal Grandfather   . Arthritis Paternal Grandmother   . Heart disease Maternal Grandmother   . COPD Father   . Stroke Maternal Aunt        MGA    No Known Allergies  No current outpatient medications on file prior to visit.   No current facility-administered medications on file prior to visit.     BP 124/82   Pulse 69   Temp 98.5 F (36.9 C) (Oral)   Resp 16   Ht 5\' 8"  (1.727 m)   Wt 189 lb 12.8 oz (86.1 kg)   SpO2  94%   BMI 28.86 kg/m      Objective:   Physical Exam  General- No acute distress. Pleasant patient. Neck- Full range of motion, no jvd Lungs- Clear, even and unlabored. Heart- regular rate and rhythm. Neurologic- CNII- XII grossly intact. Rt lower ext- medial thigh pain. Mid aspect. Mild direct pain on palpation. No apparent swelling to thigh compared to left side. Lower ext- calf appears symmetric with no selling.        Assessment & Plan:  You do appear to have muscle strain in the medial thigh region.  I am prescribing diclofenac NSAID for pain and inflammation.  Avoid any over-the-counter NSAIDs while on diclofenac.  Also giving you 4 inch Ace wrap applied you medial thigh.  This might help reduce pain in the muscle.  Please get x-ray of the knee today.  I also did place sports medicine referral.  Asked for that appointment to be in 7 to 10 days in the event that your pain does resolve.  If you do get resolution of symptoms and you could cancel a sports medicine appointment.  But did want you to have it available if pain were to persist.  I do recommend that you follow-up sometime by early fall for a complete physical exam.  Would asked that you come in fasting on that day so we can get lipid panel and other labs.  Note history of this pain did not cause concern for dvt. But will send message on xray result note that if his leg is more painful or if any asymmetric thigh swelling or calf swelling then let me know and will get [place] lower ext stat.  Mackie Pai, PA-C

## 2017-10-29 NOTE — Patient Instructions (Signed)
You do appear to have muscle strain in the medial thigh region.  I am prescribing diclofenac NSAID for pain and inflammation.  Avoid any over-the-counter NSAIDs while on diclofenac.  Also giving you 4 inch Ace wrap applied you medial thigh.  This might help reduce pain in the muscle.  Please get x-ray of the knee today.  I also did place sports medicine referral.  Asked for that appointment to be in 7 to 10 days in the event that your pain does resolve.  If you do get resolution of symptoms and you could cancel a sports medicine appointment.  But did want you to have it available if pain were to persist.  I do recommend that you follow-up sometime by early fall for a complete physical exam.  Would asked that you come in fasting on that day so we can get lipid panel and other labs.

## 2018-01-07 DIAGNOSIS — Z23 Encounter for immunization: Secondary | ICD-10-CM | POA: Diagnosis not present

## 2018-01-21 ENCOUNTER — Encounter: Payer: Self-pay | Admitting: Medical

## 2018-01-21 ENCOUNTER — Ambulatory Visit (INDEPENDENT_AMBULATORY_CARE_PROVIDER_SITE_OTHER): Payer: Self-pay | Admitting: Medical

## 2018-01-21 NOTE — Progress Notes (Signed)
Pre visit review using our clinic review tool, if applicable. No additional management support is needed unless otherwise documented below in the visit note. 

## 2019-01-20 ENCOUNTER — Telehealth: Payer: Self-pay

## 2019-01-20 NOTE — Telephone Encounter (Signed)
Copied from [place] [id number]. Topic: Appointment Scheduling - Scheduling Inquiry for Clinic >> Jan 20, 2019  7:11 AM Lennox Solders wrote: Reason for CRM: pt needs cpe around 3 pm with edward or someone on Monday. Pt has insurance now [employer]

## 2019-02-17 ENCOUNTER — Other Ambulatory Visit: Payer: Self-pay

## 2019-02-18 ENCOUNTER — Ambulatory Visit (INDEPENDENT_AMBULATORY_CARE_PROVIDER_SITE_OTHER): Payer: BC Managed Care – PPO | Admitting: Medical

## 2019-02-18 ENCOUNTER — Other Ambulatory Visit: Payer: Self-pay

## 2019-02-18 ENCOUNTER — Encounter: Payer: Self-pay | Admitting: Medical

## 2019-02-18 VITALS — BP 126/80 | HR 51 | Temp 97.9°F | Resp 16 | Ht 68.0 in | Wt 178.8 lb

## 2019-02-18 DIAGNOSIS — Z125 Encounter for screening for malignant neoplasm of prostate: Secondary | ICD-10-CM | POA: Diagnosis not present

## 2019-02-18 DIAGNOSIS — Z0000 Encounter for general adult medical examination without abnormal findings: Secondary | ICD-10-CM | POA: Diagnosis not present

## 2019-02-18 DIAGNOSIS — Z1211 Encounter for screening for malignant neoplasm of colon: Secondary | ICD-10-CM | POA: Diagnosis not present

## 2019-02-18 DIAGNOSIS — Z1283 Encounter for screening for malignant neoplasm of skin: Secondary | ICD-10-CM | POA: Diagnosis not present

## 2019-02-18 LAB — CBC WITH DIFFERENTIAL/PLATELET
Basophils Absolute: 0 10*3/uL (ref 0.0–0.1)
Basophils Relative: 0.7 % (ref 0.0–3.0)
Eosinophils Absolute: 0.3 10*3/uL (ref 0.0–0.7)
Eosinophils Relative: 4.3 % (ref 0.0–5.0)
HCT: 45 % (ref 39.0–52.0)
Hemoglobin: 15.5 g/dL (ref 13.0–17.0)
Lymphocytes Relative: 29.1 % (ref 12.0–46.0)
Lymphs Abs: 1.8 10*3/uL (ref 0.7–4.0)
MCHC: 34.5 g/dL (ref 30.0–36.0)
MCV: 93 fl (ref 78.0–100.0)
Monocytes Absolute: 0.6 10*3/uL (ref 0.1–1.0)
Monocytes Relative: 9.9 % (ref 3.0–12.0)
Neutro Abs: 3.5 10*3/uL (ref 1.4–7.7)
Neutrophils Relative %: 56 % (ref 43.0–77.0)
Platelets: 329 10*3/uL (ref 150.0–400.0)
RBC: 4.84 Mil/uL (ref 4.22–5.81)
RDW: 12.8 % (ref 11.5–15.5)
WBC: 6.3 10*3/uL (ref 4.0–10.5)

## 2019-02-18 LAB — COMPREHENSIVE METABOLIC PANEL
ALT: 21 U/L (ref 0–53)
AST: 18 U/L (ref 0–37)
Albumin: 4.3 g/dL (ref 3.5–5.2)
Alkaline Phosphatase: 67 U/L (ref 39–117)
BUN: 19 mg/dL (ref 6–23)
CO2: 28 mEq/L (ref 19–32)
Calcium: 9.3 mg/dL (ref 8.4–10.5)
Chloride: 103 mEq/L (ref 96–112)
Creatinine, Ser: 1.01 mg/dL (ref 0.40–1.50)
GFR: 93.97 mL/min (ref >60.00)
Glucose, Bld: 109 mg/dL — ABNORMAL HIGH (ref 70–99)
Potassium: 4.5 mEq/L (ref 3.5–5.1)
Sodium: 139 mEq/L (ref 135–145)
Total Bilirubin: 0.6 mg/dL (ref 0.2–1.2)
Total Protein: 6.9 g/dL (ref 6.0–8.3)

## 2019-02-18 LAB — PSA: PSA: 0.64 ng/mL (ref 0.10–4.00)

## 2019-02-18 LAB — LIPID PANEL
Cholesterol: 176 mg/dL (ref 0–200)
HDL: 59.6 mg/dL (ref >39.00)
LDL Cholesterol: 99 mg/dL (ref 0–99)
NonHDL: 116.47
Total CHOL/HDL Ratio: 3
Triglycerides: 85 mg/dL (ref 0.0–149.0)
VLDL: 17 mg/dL (ref 0.0–40.0)

## 2019-02-18 NOTE — Patient Instructions (Addendum)
For you wellness exam today I have ordered cbc, cmp, psa and  lipid panel.  Already had flu vaccine.  Recommend exercise and healthy diet.  We will let you know lab results as they come in.  Follow up date appointment will be determined after lab review.    Preventive Care 44-51 Years Old, Male Preventive care refers to lifestyle choices and visits with your health care provider that can promote health and wellness. This includes:  A yearly physical exam. This is also called an annual well check.  Regular dental and eye exams.  Immunizations.  Screening for certain conditions.  Healthy lifestyle choices, such as eating a healthy diet, getting regular exercise, not using drugs or products that contain nicotine and tobacco, and limiting alcohol use. What can I expect for my preventive care visit? Physical exam Your health care provider will check:  Height and weight. These may be used to calculate body mass index (BMI), which is a measurement that tells if you are at a healthy weight.  Heart rate and blood pressure.  Your skin for abnormal spots. Counseling Your health care provider may ask you questions about:  Alcohol, tobacco, and drug use.  Emotional well-being.  Home and relationship well-being.  Sexual activity.  Eating habits.  Work and work Statistician. What immunizations do I need?  Influenza (flu) vaccine  This is recommended every year. Tetanus, diphtheria, and pertussis (Tdap) vaccine  You may need a Td booster every 10 years. Varicella (chickenpox) vaccine  You may need this vaccine if you have not already been vaccinated. Zoster (shingles) vaccine  You may need this after age 10. Measles, mumps, and rubella (MMR) vaccine  You may need at least one dose of MMR if you were born in 1957 or later. You may also need a second dose. Pneumococcal conjugate (PCV13) vaccine  You may need this if you have certain conditions and were not previously  vaccinated. Pneumococcal polysaccharide (PPSV23) vaccine  You may need one or two doses if you smoke cigarettes or if you have certain conditions. Meningococcal conjugate (MenACWY) vaccine  You may need this if you have certain conditions. Hepatitis A vaccine  You may need this if you have certain conditions or if you travel or work in places where you may be exposed to hepatitis A. Hepatitis B vaccine  You may need this if you have certain conditions or if you travel or work in places where you may be exposed to hepatitis B. Haemophilus influenzae type b (Hib) vaccine  You may need this if you have certain risk factors. Human papillomavirus (HPV) vaccine  If recommended by your health care provider, you may need three doses over 6 months. You may receive vaccines as individual doses or as more than one vaccine together in one shot (combination vaccines). Talk with your health care provider about the risks and benefits of combination vaccines. What tests do I need? Blood tests  Lipid and cholesterol levels. These may be checked every 5 years, or more frequently if you are over 21 years old.  Hepatitis C test.  Hepatitis B test. Screening  Lung cancer screening. You may have this screening every year starting at age 57 if you have a 30-pack-year history of smoking and currently smoke or have quit within the past 15 years.  Prostate cancer screening. Recommendations will vary depending on your family history and other risks.  Colorectal cancer screening. All adults should have this screening starting at age 39 and continuing until  age 53. Your health care provider may recommend screening at age 76 if you are at increased risk. You will have tests every 1-10 years, depending on your results and the type of screening test.  Diabetes screening. This is done by checking your blood sugar (glucose) after you have not eaten for a while (fasting). You may have this done every 1-3  years.  Sexually transmitted disease (STD) testing. Follow these instructions at home: Eating and drinking  Eat a diet that includes fresh fruits and vegetables, whole grains, lean protein, and low-fat dairy products.  Take vitamin and mineral supplements as recommended by your health care provider.  Do not drink alcohol if your health care provider tells you not to drink.  If you drink alcohol: ? Limit how much you have to 0-2 drinks a day. ? Be aware of how much alcohol is in your drink. In the [place] one drink equals one 12 oz bottle of beer (355 mL), one 5 oz glass of wine (148 mL), or one 1 oz glass of hard liquor (44 mL). Lifestyle  Take daily care of your teeth and gums.  Stay active. Exercise for at least 30 minutes on 5 or more days each week.  Do not use any products that contain nicotine or tobacco, such as cigarettes, e-cigarettes, and chewing tobacco. If you need help quitting, ask your health care provider.  If you are sexually active, practice safe sex. Use a condom or other form of protection to prevent STIs (sexually transmitted infections).  Talk with your health care provider about taking a low-dose aspirin every day starting at age 51. What's next?  Go to your health care provider once a year for a well check visit.  Ask your health care provider how often you should have your eyes and teeth checked.  Stay up to date on all vaccines. This information is not intended to replace advice given to you by your health care provider. Make sure you discuss any questions you have with your health care provider. Document Released: 04/23/2015 Document Revised: 03/21/2018 Document Reviewed: 03/21/2018 Elsevier Patient Education  2020 [employer].

## 2019-02-18 NOTE — Progress Notes (Addendum)
Subjective:    Patient ID: Michael Lewis, male    DOB: [birth year], 53 y.o.   MRN: [id number]  HPI  Pt in for cpe. He is fasting.  Pt works for [employer].  Pt not exercising regularly. Pt non smoker. Rare alcohol use. Pt states healthy diet.   Pt did get flu vaccine already this year.  Pt never had colonoscopy. No fh of colon cancer polyps.  No family hx of prostate CA.      Review of Systems  Constitutional: Negative for chills, fatigue and fever.  Respiratory: Negative for cough, chest tightness, shortness of breath and wheezing.   Cardiovascular: Negative for chest pain and palpitations.  Gastrointestinal: Negative for abdominal pain.  Genitourinary: Negative for dysuria, flank pain, hematuria, testicular pain and urgency.  Musculoskeletal: Negative for back pain and neck pain.  Skin: Negative for rash.  Neurological: Negative for dizziness, syncope, weakness and headaches.  Hematological: Negative for adenopathy. Does not bruise/bleed easily.  Psychiatric/Behavioral: Negative for behavioral problems, confusion and hallucinations. The patient is not nervous/anxious.     Past Medical History:  Diagnosis Date  . Asthma   . Hematuria 11/26/2013     Social History   Socioeconomic History  . Marital status: Married    Spouse name: Not on file  . Number of children: Not on file  . Years of education: Not on file  . Highest education level: Not on file  Occupational History  . Occupation: Driver/Warehouse  Social Needs  . Financial resource strain: Not on file  . Food insecurity    Worry: Not on file    Inability: Not on file  . Transportation needs    Medical: Not on file    Non-medical: Not on file  Tobacco Use  . Smoking status: Former Smoker    Packs/day: 1.00    Years: 25.00    Pack years: 25.00    Quit date: 01/08/2008    Years since quitting: 11.1  . Smokeless tobacco: Never Used  Substance and Sexual Activity  . Alcohol use: No   Alcohol/week: 0.0 standard drinks    Comment: Occ  . Drug use: No  . Sexual activity: Yes  Lifestyle  . Physical activity    Days per week: Not on file    Minutes per session: Not on file  . Stress: Not on file  Relationships  . Social Herbalist on phone: Not on file    Gets together: Not on file    Attends religious service: Not on file    Active member of club or organization: Not on file    Attends meetings of clubs or organizations: Not on file    Relationship status: Not on file  . Intimate partner violence    Fear of current or ex partner: Not on file    Emotionally abused: Not on file    Physically abused: Not on file    Forced sexual activity: Not on file  Other Topics Concern  . Not on file  Social History Narrative   Married. Education: [employer]. Exercise: No.    Past Surgical History:  Procedure Laterality Date  . NASAL SEPTUM SURGERY    . TONSILLECTOMY      Family History  Problem Relation Age of Onset  . Alzheimer's disease Maternal Grandfather   . Arthritis Paternal Grandmother   . Heart disease Maternal Grandmother   . COPD Father   . Stroke Maternal Aunt  MGA    No Known Allergies  No current outpatient medications on file prior to visit.   No current facility-administered medications on file prior to visit.     BP 126/80   Pulse (!) 51   Temp 97.9 F (36.6 C) (Temporal)   Resp 16   Ht 5\' 8"  (1.727 m)   Wt 178 lb 12.8 oz (81.1 kg)   SpO2 98%   BMI 27.19 kg/m       Objective:   Physical Exam   General Mental Status- Alert. General Appearance- Not in acute distress.   Skin General: Color- Normal Color. Moisture- Normal Moisture. Tiny scattered moles on back. But one moderate sized in about 8 mm wide mid t-spine location.  Neck Carotid Arteries- Normal color. Moisture- Normal Moisture. No carotid bruits. No JVD.  Chest and Lung Exam Auscultation: Breath Sounds:-Normal.  Cardiovascular  Auscultation:Rythm- Regular. Murmurs & Other Heart Sounds:Auscultation of the heart reveals- No Murmurs.  Abdomen Inspection:-Inspeection Normal. Palpation/Percussion:Note:No mass. Palpation and Percussion of the abdomen reveal- Non Tender, Non Distended + BS, no rebound or guarding.   Neurologic Cranial Nerve exam:- CN III-XII intact(No nystagmus), symmetric smile. Strength:- 5/5 equal and symmetric strength both upper and lower extremities.  Skin- small moles on back. Only one borderline in size. Uniform in color.  Genito-urinary- pt declines/deferred.     Assessment & Plan:  For you wellness exam today I have ordered cbc, cmp, psa and  lipid panel.  Already had flu vaccine.  Recommend exercise and healthy diet.  We will let you know lab results as they come in.  Follow up date appointment will be determined after lab review.

## 2019-02-20 ENCOUNTER — Other Ambulatory Visit (INDEPENDENT_AMBULATORY_CARE_PROVIDER_SITE_OTHER): Payer: BC Managed Care – PPO

## 2019-02-20 DIAGNOSIS — R739 Hyperglycemia, unspecified: Secondary | ICD-10-CM | POA: Diagnosis not present

## 2019-02-20 LAB — HEMOGLOBIN A1C: Hgb A1c MFr Bld: 5.7 % (ref 4.6–6.5)

## 2019-03-14 ENCOUNTER — Encounter: Payer: Self-pay | Admitting: Gastroenterology

## 2019-03-17 DIAGNOSIS — D225 Melanocytic nevi of trunk: Secondary | ICD-10-CM | POA: Diagnosis not present

## 2019-03-31 ENCOUNTER — Telehealth: Payer: Self-pay | Admitting: *Deleted

## 2019-03-31 NOTE — Telephone Encounter (Signed)
Patient no show PV today, called pt, no answer, left message for him to call us back today before 5 pm to reschedule the nurse visit or all appointments will be cancelled per protocol.

## 2019-03-31 NOTE — Telephone Encounter (Signed)
Cancelled colon and PV. Mailed no show letter.

## 2019-04-10 ENCOUNTER — Other Ambulatory Visit: Payer: Self-pay

## 2019-04-10 ENCOUNTER — Ambulatory Visit (AMBULATORY_SURGERY_CENTER): Payer: Self-pay | Admitting: *Deleted

## 2019-04-10 VITALS — Temp 98.1°F | Ht 68.0 in | Wt 183.8 lb

## 2019-04-10 DIAGNOSIS — Z1211 Encounter for screening for malignant neoplasm of colon: Secondary | ICD-10-CM

## 2019-04-10 DIAGNOSIS — Z1159 Encounter for screening for other viral diseases: Secondary | ICD-10-CM

## 2019-04-10 MED ORDER — SUPREP BOWEL PREP KIT 17.5-3.13-1.6 GM/177ML PO SOLN: 1.0000 | Freq: Once | ORAL | 0 refills | Status: AC

## 2019-04-10 NOTE — Progress Notes (Signed)
Patient denies any allergies to egg or soy products. Patient denies complications with anesthesia/sedation.  Patient denies oxygen use at home and denies diet medications. Emmi instructions for colonoscopy/endoscopy explained and given to patient.  Suprep coupon given.   

## 2019-04-11 HISTORY — PX: COLONOSCOPY: SHX174

## 2019-04-18 ENCOUNTER — Encounter: Payer: Self-pay | Admitting: Gastroenterology

## 2019-04-18 ENCOUNTER — Encounter: Payer: BC Managed Care – PPO | Admitting: Gastroenterology

## 2019-04-21 ENCOUNTER — Encounter: Payer: Self-pay | Admitting: Gastroenterology

## 2019-04-22 ENCOUNTER — Other Ambulatory Visit: Payer: Self-pay | Admitting: Gastroenterology

## 2019-04-22 ENCOUNTER — Ambulatory Visit (INDEPENDENT_AMBULATORY_CARE_PROVIDER_SITE_OTHER): Payer: BC Managed Care – PPO

## 2019-04-22 DIAGNOSIS — Z1159 Encounter for screening for other viral diseases: Secondary | ICD-10-CM | POA: Diagnosis not present

## 2019-04-23 LAB — SARS CORONAVIRUS 2 (TAT 6-24 HRS): SARS Coronavirus 2: NEGATIVE

## 2019-04-25 ENCOUNTER — Other Ambulatory Visit: Payer: Self-pay

## 2019-04-25 ENCOUNTER — Encounter: Payer: Self-pay | Admitting: Gastroenterology

## 2019-04-25 ENCOUNTER — Ambulatory Visit (AMBULATORY_SURGERY_CENTER): Payer: BC Managed Care – PPO | Admitting: Gastroenterology

## 2019-04-25 ENCOUNTER — Encounter: Payer: BC Managed Care – PPO | Admitting: Gastroenterology

## 2019-04-25 VITALS — BP 104/74 | HR 52 | Temp 97.9°F | Resp 12 | Ht 68.0 in | Wt 183.8 lb

## 2019-04-25 DIAGNOSIS — D125 Benign neoplasm of sigmoid colon: Secondary | ICD-10-CM

## 2019-04-25 DIAGNOSIS — D120 Benign neoplasm of cecum: Secondary | ICD-10-CM

## 2019-04-25 DIAGNOSIS — Z1211 Encounter for screening for malignant neoplasm of colon: Secondary | ICD-10-CM

## 2019-04-25 DIAGNOSIS — D123 Benign neoplasm of transverse colon: Secondary | ICD-10-CM | POA: Diagnosis not present

## 2019-04-25 MED ORDER — SODIUM CHLORIDE 0.9 % IV SOLN: 500.0000 mL | Freq: Once | INTRAVENOUS | Status: DC

## 2019-04-25 NOTE — Patient Instructions (Signed)
Please read handouts provided. Continue present medications. Await pathology results. High Fiber diet.       YOU HAD AN ENDOSCOPIC PROCEDURE TODAY AT [facility]:   Refer to the procedure report that was given to you for any specific questions about what was found during the examination.  If the procedure report does not answer your questions, please call your gastroenterologist to clarify.  If you requested that your care partner not be given the details of your procedure findings, then the procedure report has been included in a sealed envelope for you to review at your convenience later.  YOU SHOULD EXPECT: Some feelings of bloating in the abdomen. Passage of more gas than usual.  Walking can help get rid of the air that was put into your GI tract during the procedure and reduce the bloating. If you had a lower endoscopy (such as a colonoscopy or flexible sigmoidoscopy) you may notice spotting of blood in your stool or on the toilet paper. If you underwent a bowel prep for your procedure, you may not have a normal bowel movement for a few days.  Please Note:  You might notice some irritation and congestion in your nose or some drainage.  This is from the oxygen used during your procedure.  There is no need for concern and it should clear up in a day or so.  SYMPTOMS TO REPORT IMMEDIATELY:   Following lower endoscopy (colonoscopy or flexible sigmoidoscopy):  Excessive amounts of blood in the stool  Significant tenderness or worsening of abdominal pains  Swelling of the abdomen that is new, acute  Fever of 100F or higher    For urgent or emergent issues, a gastroenterologist can be reached at any hour by calling [phone].   DIET:  We do recommend a small meal at first, but then you may proceed to your regular diet.  Drink plenty of fluids but you should avoid alcoholic beverages for 24 hours.  ACTIVITY:  You should plan to take it easy for the rest of today  and you should NOT DRIVE or use heavy machinery until tomorrow (because of the sedation medicines used during the test).    FOLLOW UP: Our staff will call the number listed on your records 48-72 hours following your procedure to check on you and address any questions or concerns that you may have regarding the information given to you following your procedure. If we do not reach you, we will leave a message.  We will attempt to reach you two times.  During this call, we will ask if you have developed any symptoms of COVID 19. If you develop any symptoms (ie: fever, flu-like symptoms, shortness of breath, cough etc.) before then, please call [phone].  If you test positive for Covid 19 in the 2 weeks post procedure, please call and report this information to [place].    If any biopsies were taken you will be contacted by phone or by letter within the next 1-3 weeks.  Please call us at [phone] if you have not heard about the biopsies in 3 weeks.    SIGNATURES/CONFIDENTIALITY: You and/or your care partner have signed paperwork which will be entered into your electronic medical record.  These signatures attest to the fact that that the information above on your After Visit Summary has been reviewed and is understood.  Full responsibility of the confidentiality of this discharge information lies with you and/or your care-partner.

## 2019-04-25 NOTE — Op Note (Signed)
[place] Patient Name: Michael Lewis Procedure Date: 04/25/2019 8:27 AM MRN: [id number] Endoscopist: Ladene Artist , MD Age: 54 Referring MD:  Date of Birth: [birth year] Gender: Male Account #: [id number] Procedure:                Colonoscopy Indications:              Screening for colorectal malignant neoplasm Medicines:                Monitored Anesthesia Care Procedure:                Pre-Anesthesia Assessment:                           - Prior to the procedure, a History and Physical                            was performed, and patient medications and                            allergies were reviewed. The patient's tolerance of                            previous anesthesia was also reviewed. The risks                            and benefits of the procedure and the sedation                            options and risks were discussed with the patient.                            All questions were answered, and informed consent                            was obtained. Prior Anticoagulants: The patient has                            taken no previous anticoagulant or antiplatelet                            agents. ASA Grade Assessment: II - A patient with                            mild systemic disease. After reviewing the risks                            and benefits, the patient was deemed in                            satisfactory condition to undergo the procedure.                           After obtaining informed consent, the colonoscope  was passed under direct vision. Throughout the                            procedure, the patient's blood pressure, pulse, and                            oxygen saturations were monitored continuously. The                            Colonoscope was introduced through the anus and                            advanced to the the cecum, identified by                            appendiceal orifice and  ileocecal valve. The                            ileocecal valve, appendiceal orifice, and rectum                            were photographed. The quality of the bowel                            preparation was excellent. The colonoscopy was                            performed without difficulty. The patient tolerated                            the procedure well. Scope In: 8:29:50 AM Scope Out: [id number] AM Scope Withdrawal Time: 0 hours 14 minutes 28 seconds  Total Procedure Duration: 0 hours 16 minutes 24 seconds  Findings:                 The perianal and digital rectal examinations were                            normal.                           Six sessile polyps were found in the sigmoid colon                            (2), transverse colon (3) and cecum (1). The polyps                            were 5 to 8 mm in size. These polyps were removed                            with a cold snare. Resection and retrieval were                            complete.  Multiple medium-mouthed diverticula were found in                            the left colon. There was narrowing of the colon in                            association with the diverticular opening.                            Peri-diverticular erythema was seen. There was no                            evidence of diverticular bleeding.                           Internal hemorrhoids were found during                            retroflexion. The hemorrhoids were small and Grade                            I (internal hemorrhoids that do not prolapse).                           The exam was otherwise without abnormality on                            direct and retroflexion views. Complications:            No immediate complications. Estimated blood loss:                            None. Estimated Blood Loss:     Estimated blood loss: none. Impression:               - Six 5 to 8 mm polyps in the sigmoid  colon, in the                            transverse colon and in the cecum, removed with a                            cold snare. Resected and retrieved.                           - Moderate diverticulosis in the left colon.                           - Internal hemorrhoids.                           - The examination was otherwise normal on direct                            and retroflexion views. Recommendation:           - Repeat colonoscopy after studies are  complete for                            surveillance based on pathology results.                           - Patient has a contact number available for                            emergencies. The signs and symptoms of potential                            delayed complications were discussed with the                            patient. Return to normal activities tomorrow.                            Written discharge instructions were provided to the                            patient.                           - High fiber diet.                           - Continue present medications.                           - Await pathology results. Ladene Artist, MD 04/25/2019 8:50:53 AM This report has been signed electronically.

## 2019-04-25 NOTE — Progress Notes (Signed)
Temperature- Michael Lewis  Pt's states no medical or surgical changes since previsit or office visit.

## 2019-04-25 NOTE — Progress Notes (Signed)
Report given to PACU, vss 

## 2019-04-25 NOTE — Progress Notes (Signed)
Called to room to assist during endoscopic procedure.  Patient ID and intended procedure confirmed with present staff. Received instructions for my participation in the procedure from the performing physician.  

## 2019-04-29 ENCOUNTER — Telehealth: Payer: Self-pay | Admitting: *Deleted

## 2019-04-29 NOTE — Telephone Encounter (Signed)
  Follow up Call-  Call back number 04/25/2019  Post procedure Call Back phone  # [phone]  Permission to leave phone message Yes  Some recent data might be hidden     Patient questions:  Do you have a fever, pain , or abdominal swelling? No. Pain Score  0 *  Have you tolerated food without any problems? Yes.    Have you been able to return to your normal activities? Yes.    Do you have any questions about your discharge instructions: Diet   No. Medications  No. Follow up visit  No.  Do you have questions or concerns about your Care? No.  Actions: * If pain score is 4 or above: No action needed, pain <4.  1. Have you developed a fever since your procedure? no  2.   Have you had an respiratory symptoms (SOB or cough) since your procedure? no  3.   Have you tested positive for COVID 19 since your procedure no  4.   Have you had any family members/close contacts diagnosed with the COVID 19 since your procedure?  no   If yes to any of these questions please route to Joylene John, RN and Alphonsa Gin, Therapist, sports.

## 2019-05-01 ENCOUNTER — Encounter: Payer: Self-pay | Admitting: Gastroenterology

## 2020-10-18 ENCOUNTER — Encounter: Payer: Self-pay | Admitting: Medical

## 2020-10-18 ENCOUNTER — Other Ambulatory Visit: Payer: Self-pay

## 2020-10-18 ENCOUNTER — Telehealth: Payer: Self-pay | Admitting: Medical

## 2020-10-18 ENCOUNTER — Ambulatory Visit: Payer: BC Managed Care – PPO | Admitting: Medical

## 2020-10-18 VITALS — BP 100/70 | HR 72 | Temp 98.7°F | Resp 18 | Ht 68.0 in | Wt 166.4 lb

## 2020-10-18 DIAGNOSIS — J40 Bronchitis, not specified as acute or chronic: Secondary | ICD-10-CM | POA: Diagnosis not present

## 2020-10-18 DIAGNOSIS — R059 Cough, unspecified: Secondary | ICD-10-CM

## 2020-10-18 DIAGNOSIS — J029 Acute pharyngitis, unspecified: Secondary | ICD-10-CM

## 2020-10-18 DIAGNOSIS — E010 Iodine-deficiency related diffuse (endemic) goiter: Secondary | ICD-10-CM

## 2020-10-18 DIAGNOSIS — Z87891 Personal history of nicotine dependence: Secondary | ICD-10-CM

## 2020-10-18 MED ORDER — AZITHROMYCIN 250 MG PO TABS: ORAL_TABLET | ORAL | 0 refills | Status: AC

## 2020-10-18 MED ORDER — BENZONATATE 100 MG PO CAPS: 100.0000 mg | ORAL_CAPSULE | Freq: Three times a day (TID) | ORAL | 0 refills | Status: DC | PRN

## 2020-10-18 NOTE — Patient Instructions (Addendum)
For both pharyngitis and bronchitis prescribed azithromycin antibiotic. For cough rx benzonatate.  On exam of neck thought mild thyromegaly. Order for tsh, t4 and thyroid US today.  On review with hx of smoking 24 years and pack a day place ct chest screening. Glad you stopped 10 year or more ago.  Follow up 10-14 days or as needed

## 2020-10-18 NOTE — Progress Notes (Signed)
Subjective:    Patient ID: Michael Lewis, male    DOB: May 12, 1965, 55 y.o.   MRN: [id number]  HPI Since last Thursday got scratchy throat, cough andmild sweating. 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  Pt tested for covid wed or Thursday and tested negative.  No sob or myalgia.  Pt states mild chest congestion. When he cough will occasionally bring up mucus.  Pt has hx of smoking but stopped more than 10 years ago. He smoked. But smoked about pack a day for 24 years.  Pt overall feels better today than compared to before.  Pt had 3 covid vaccines.  No known exposure to strep.   Review of Systems  Constitutional:  Negative for chills, fatigue and fever.  HENT:  Positive for sore throat. Negative for congestion.   Respiratory:  Positive for cough. Negative for chest tightness, shortness of breath and wheezing.        Some chest congestion.  Cardiovascular:  Negative for chest pain and palpitations.  Gastrointestinal:  Negative for abdominal pain, constipation and nausea.  Musculoskeletal:  Negative for back pain.   Past Medical History:  Diagnosis Date   Allergy    Asthma    as a child, no problems as adult, no inhaler   Hematuria 11/26/2013     Social History   Socioeconomic History   Marital status: Married    Spouse name: Not on file   Number of children: Not on file   Years of education: Not on file   Highest education level: Not on file  Occupational History   Occupation: Driver/Warehouse  Tobacco Use   Smoking status: Former    Packs/day: 1.00    Years: 25.00    Pack years: 25.00    Types: Cigarettes    Quit date: 01/08/2008    Years since quitting: 12.7   Smokeless tobacco: Never  Vaping Use   Vaping Use: Never used  Substance and Sexual Activity   Alcohol use: No    Alcohol/week: 0.0 standard drinks   Drug use: No   Sexual activity: Yes  Other Topics Concern   Not on file  Social History Narrative   Married. Education: [employer]. Exercise: No.   Social Determinants of Health   Financial Resource Strain: Not on file  Food Insecurity: Not on file  Transportation Needs: Not on file  Physical Activity: Not on file  Stress: Not on file  Social Connections: Not on file  Intimate Partner Violence: Not on file    Past Surgical History:  Procedure Laterality Date   NASAL SEPTUM SURGERY     x 2   TONSILLECTOMY     WISDOM TOOTH EXTRACTION      Family History  Problem Relation Age of Onset   Alzheimer's disease Maternal Grandfather    Arthritis Paternal Grandmother    Heart disease Maternal Grandmother    COPD Father    Stroke Maternal Aunt        MGA   Colon cancer Neg Hx    Rectal cancer Neg Hx    Stomach cancer Neg Hx    Esophageal cancer Neg Hx     No Known Allergies  Current Outpatient Medications on File Prior to Visit  Medication Sig Dispense Refill   cetirizine (ZYRTEC) 10 MG tablet Take 10 mg by mouth as needed for allergies.     No current facility-administered medications on file prior to visit.    BP  100/70 (BP Location: Right Arm, Patient Position: Sitting, Cuff Size: Normal)   Pulse 72   Temp 98.7 F (37.1 C) (Oral)   Resp 18   Ht 5\' 8"  (1.727 m)   Wt 166 lb 6.4 oz (75.5 kg)   SpO2 94%   BMI 25.30 kg/m        Objective:   Physical Exam   General- No acute distress. Pleasant patient. Neck- Full range of motion, no jvd Lungs- Clear, even and unlabored. Heart- regular rate and rhythm. Neurologic- CNII- XII grossly intact.   Heent- no frontal or maxillary sinus pressure. Posterior pharynx shows -mild-moderate bright red. No hypertrophy/  Neck- on palpation no lymph nodes felt but thyroid left side region feels larger compared to rt side.        Assessment & Plan:   For both pharyngitis and bronchitis prescribed azithromycin antibiotic. For cough rx benzonatate.  On exam of neck thought mild thyromegaly. Order for tsh, t4 and thyroid US today.  On review  with hx of smoking 24 years and pack a day place ct chest screening. Glad you stopped 10 year or more ago.  Follow up 10-14 days or as needed  [employer], [employer]

## 2020-10-18 NOTE — Telephone Encounter (Signed)
Will you look at pt ct chest for screening lung cancer. Does he need prior authorization.

## 2020-10-25 ENCOUNTER — Ambulatory Visit (HOSPITAL_BASED_OUTPATIENT_CLINIC_OR_DEPARTMENT_OTHER)
Admission: RE | Admit: 2020-10-25 | Discharge: 2020-10-25 | Disposition: A | Discharge status: Home / Self Care | Payer: BC Managed Care – PPO | Source: Ambulatory Visit | Attending: Medical | Admitting: Medical

## 2020-10-25 ENCOUNTER — Other Ambulatory Visit: Payer: Self-pay

## 2020-10-25 DIAGNOSIS — E010 Iodine-deficiency related diffuse (endemic) goiter: Secondary | ICD-10-CM | POA: Diagnosis not present

## 2020-10-25 DIAGNOSIS — Z87891 Personal history of nicotine dependence: Secondary | ICD-10-CM | POA: Insufficient documentation

## 2020-10-25 DIAGNOSIS — R059 Cough, unspecified: Secondary | ICD-10-CM | POA: Insufficient documentation

## 2020-10-29 DIAGNOSIS — U071 COVID-19: Secondary | ICD-10-CM | POA: Diagnosis not present

## 2020-10-29 DIAGNOSIS — R059 Cough, unspecified: Secondary | ICD-10-CM | POA: Diagnosis not present

## 2020-10-29 DIAGNOSIS — R509 Fever, unspecified: Secondary | ICD-10-CM | POA: Diagnosis not present

## 2020-11-01 ENCOUNTER — Ambulatory Visit: Payer: BC Managed Care – PPO | Admitting: Medical

## 2020-11-02 DIAGNOSIS — U071 COVID-19: Secondary | ICD-10-CM | POA: Diagnosis not present

## 2020-11-02 DIAGNOSIS — R059 Cough, unspecified: Secondary | ICD-10-CM | POA: Diagnosis not present

## 2020-11-08 DIAGNOSIS — R0602 Shortness of breath: Secondary | ICD-10-CM | POA: Diagnosis not present

## 2020-11-08 DIAGNOSIS — R059 Cough, unspecified: Secondary | ICD-10-CM | POA: Diagnosis not present

## 2020-11-08 DIAGNOSIS — U071 COVID-19: Secondary | ICD-10-CM | POA: Diagnosis not present

## 2020-11-08 DIAGNOSIS — Z1159 Encounter for screening for other viral diseases: Secondary | ICD-10-CM | POA: Diagnosis not present

## 2020-11-15 ENCOUNTER — Ambulatory Visit: Payer: BC Managed Care – PPO | Admitting: Medical

## 2020-11-16 ENCOUNTER — Other Ambulatory Visit: Payer: Self-pay

## 2020-11-16 ENCOUNTER — Ambulatory Visit: Payer: BC Managed Care – PPO | Admitting: Medical

## 2020-11-16 VITALS — BP 115/73 | HR 59 | Resp 18 | Ht 68.0 in | Wt 171.0 lb

## 2020-11-16 DIAGNOSIS — J40 Bronchitis, not specified as acute or chronic: Secondary | ICD-10-CM

## 2020-11-16 DIAGNOSIS — Z87891 Personal history of nicotine dependence: Secondary | ICD-10-CM

## 2020-11-16 DIAGNOSIS — R911 Solitary pulmonary nodule: Secondary | ICD-10-CM

## 2020-11-16 DIAGNOSIS — J029 Acute pharyngitis, unspecified: Secondary | ICD-10-CM | POA: Diagnosis not present

## 2020-11-16 NOTE — Progress Notes (Signed)
Subjective:    Patient ID: Michael Lewis, male    DOB: [birth year], 55 y.o.   MRN: [id number]  HPI  Pt in for follow up. Pt st and bronchitis signs and symptoms. I had prescribed zpack and benzonatate.   Pt states after he saw me/about one week later he took covid test +. He got over that reporting mostly mild symptoms.  Pt thyroid US was negative. Pt states normal energy. Tsh and t4 not done.  Ct of chest showed.  IMPRESSION: 1. Lung-RADS 3, probably benign findings. Short-term follow-up in 6 months is recommended with repeat low-dose chest CT without contrast (please use the following order, "CT CHEST LCS NODULE FOLLOW-UP W/O CM"). 2. Diffuse bronchial wall thickening with emphysema, as above; imaging findings suggestive of underlying COPD.  Pt never has sob or wheezing.  Made aware today to get ct recommended repeat in 6 months.   Review of Systems  Constitutional:  Negative for chills, fatigue and fever.  HENT:  Negative for congestion, ear discharge, ear pain, facial swelling, postnasal drip, sneezing and sore throat.   Respiratory:  Negative for cough, chest tightness, shortness of breath and wheezing.   Cardiovascular:  Negative for chest pain and palpitations.  Gastrointestinal:  Negative for abdominal pain, blood in stool and constipation.  Genitourinary:  Negative for dysuria.  Musculoskeletal:  Negative for arthralgias, gait problem and neck pain.  Skin:  Negative for rash.  Neurological:  Negative for dizziness, syncope, weakness, light-headedness, numbness and headaches.  Hematological:  Negative for adenopathy.  Psychiatric/Behavioral:  Negative for behavioral problems, decreased concentration, hallucinations and suicidal ideas.     Past Medical History:  Diagnosis Date   Allergy    Asthma    as a child, no problems as adult, no inhaler   Hematuria 11/26/2013     Social History   Socioeconomic History   Marital status: Married    Spouse name: Not on  file   Number of children: Not on file   Years of education: Not on file   Highest education level: Not on file  Occupational History   Occupation: Driver/Warehouse  Tobacco Use   Smoking status: Former    Packs/day: 1.00    Years: 25.00    Pack years: 25.00    Types: Cigarettes    Quit date: 01/08/2008    Years since quitting: 12.8   Smokeless tobacco: Never  Vaping Use   Vaping Use: Never used  Substance and Sexual Activity   Alcohol use: No    Alcohol/week: 0.0 standard drinks   Drug use: No   Sexual activity: Yes  Other Topics Concern   Not on file  Social History Narrative   Married. Education: [employer]. Exercise: No.   Social Determinants of Health   Financial Resource Strain: Not on file  Food Insecurity: Not on file  Transportation Needs: Not on file  Physical Activity: Not on file  Stress: Not on file  Social Connections: Not on file  Intimate Partner Violence: Not on file    Past Surgical History:  Procedure Laterality Date   NASAL SEPTUM SURGERY     x 2   TONSILLECTOMY     WISDOM TOOTH EXTRACTION      Family History  Problem Relation Age of Onset   Alzheimer's disease Maternal Grandfather    Arthritis Paternal Grandmother    Heart disease Maternal Grandmother    COPD Father    Stroke Maternal Aunt  MGA   Colon cancer Neg Hx    Rectal cancer Neg Hx    Stomach cancer Neg Hx    Esophageal cancer Neg Hx     No Known Allergies  Current Outpatient Medications on File Prior to Visit  Medication Sig Dispense Refill   cetirizine (ZYRTEC) 10 MG tablet Take 10 mg by mouth as needed for allergies.     benzonatate (TESSALON) 100 MG capsule Take 1 capsule (100 mg total) by mouth 3 (three) times daily as needed for cough. 30 capsule 0   No current facility-administered medications on file prior to visit.    BP 115/73   Pulse (!) 59   Resp 18   Ht '5\' 8"'$  (1.727 m)   Wt 171 lb (77.6 kg)   SpO2 98%   BMI 26.00 kg/m       Objective:    Physical Exam  General- No acute distress. Pleasant patient. Neck- Full range of motion, no jvd Lungs- Clear, even and unlabored. Heart- regular rate and rhythm. Neurologic- CNII- XII grossly intact.  Heent- normal pharynx. No sinus pressure on palpation.       Assessment & Plan:   Resolved  pharyngitis and bronchitis.  Symptoms resolved completely with azithromycin and benzonatate.  Interestingly you had COVID about 1 week later and got over that as well.  History of smoking with some COPD type findings on screening CT chest.  No wheezing or shortness of breath reported.  Presently declining referral to pulmonologist and clinically stable.  If you get any wheezing or shortness of breath let me know.  In that event could prescribe you inhaler and place referral to pulmonologist at that point.  Some lung nodules found on chest CT and radiologist recommends repeating CT chest in 6 months.  Would asked that you go ahead and schedule wellness exam in mid to late January.  At that time would place the repeat CT chest and I will get prior authorization.  Very important to keep that appointment and repeat CT.  Mackie Pai, PA-C

## 2020-11-16 NOTE — Patient Instructions (Addendum)
Resolved  pharyngitis and bronchitis.  Symptoms resolved completely with azithromycin and benzonatate.  Interestingly you had COVID about 1 week later and got over that as well.  History of smoking with some COPD type findings on screening CT chest.  No wheezing or shortness of breath reported.  Presently declining referral to pulmonologist and clinically stable.  If you get any wheezing or shortness of breath let me know.  In that event could prescribe you inhaler and place referral to pulmonologist at that point.  Some lung nodules found on chest CT and radiologist recommends repeating CT chest in 6 months.  Would asked that you go ahead and schedule wellness exam in mid to late January.  At that time would place the repeat CT chest and I will get prior authorization.  Very important to keep that appointment and repeat CT.

## 2021-03-29 DIAGNOSIS — L718 Other rosacea: Secondary | ICD-10-CM | POA: Diagnosis not present

## 2021-03-29 DIAGNOSIS — D225 Melanocytic nevi of trunk: Secondary | ICD-10-CM | POA: Diagnosis not present

## 2021-05-09 ENCOUNTER — Ambulatory Visit (INDEPENDENT_AMBULATORY_CARE_PROVIDER_SITE_OTHER): Payer: BC Managed Care – PPO | Admitting: Medical

## 2021-05-09 VITALS — BP 111/60 | HR 70 | Resp 18 | Ht 68.0 in | Wt 167.0 lb

## 2021-05-09 DIAGNOSIS — R739 Hyperglycemia, unspecified: Secondary | ICD-10-CM | POA: Diagnosis not present

## 2021-05-09 DIAGNOSIS — Z0000 Encounter for general adult medical examination without abnormal findings: Secondary | ICD-10-CM

## 2021-05-09 DIAGNOSIS — Z125 Encounter for screening for malignant neoplasm of prostate: Secondary | ICD-10-CM

## 2021-05-09 NOTE — Patient Instructions (Addendum)
For you wellness exam today I have ordered cbc, cmp and  lipid panel. [facility] for screening.  Vaccine up to date except shingles. Recommend ask your insurance if they cover. If so call and see if we can give or if we recommend pharmacy.  Recommend exercise and healthy diet.  We will let you know lab results as they come in.  Follow up date appointment will be determined after lab review.     Preventive Care 57-54 Years Old, Male Preventive care refers to lifestyle choices and visits with your health care provider that can promote health and wellness. Preventive care visits are also called wellness exams. What can I expect for my preventive care visit? Counseling During your preventive care visit, your health care provider may ask about your: Medical history, including: Past medical problems. Family medical history. Current health, including: Emotional well-being. Home life and relationship well-being. Sexual activity. Lifestyle, including: Alcohol, nicotine or tobacco, and drug use. Access to firearms. Diet, exercise, and sleep habits. Safety issues such as seatbelt and bike helmet use. Sunscreen use. Work and work Statistician. Physical exam Your health care provider will check your: Height and weight. These may be used to calculate your BMI (body mass index). BMI is a measurement that tells if you are at a healthy weight. Waist circumference. This measures the distance around your waistline. This measurement also tells if you are at a healthy weight and may help predict your risk of certain diseases, such as type 2 diabetes and high blood pressure. Heart rate and blood pressure. Body temperature. Skin for abnormal spots. What immunizations do I need? Vaccines are usually given at various ages, according to a schedule. Your health care provider will recommend vaccines for you based on your age, medical history, and lifestyle or other factors, such as travel or where you  work. What tests do I need? Screening Your health care provider may recommend screening tests for certain conditions. This may include: Lipid and cholesterol levels. Diabetes screening. This is done by checking your blood sugar (glucose) after you have not eaten for a while (fasting). Hepatitis B test. Hepatitis C test. HIV (human immunodeficiency virus) test. STI (sexually transmitted infection) testing, if you are at risk. Lung cancer screening. Prostate cancer screening. Colorectal cancer screening. Talk with your health care provider about your test results, treatment options, and if necessary, the need for more tests. Follow these instructions at home: Eating and drinking  Eat a diet that includes fresh fruits and vegetables, whole grains, lean protein, and low-fat dairy products. Take vitamin and mineral supplements as recommended by your health care provider. Do not drink alcohol if your health care provider tells you not to drink. If you drink alcohol: Limit how much you have to 0-2 drinks a day. Know how much alcohol is in your drink. In the [place] one drink equals one 12 oz bottle of beer (355 mL), one 5 oz glass of wine (148 mL), or one 1 oz glass of hard liquor (44 mL). Lifestyle Brush your teeth every morning and night with fluoride toothpaste. Floss one time each day. Exercise for at least 30 minutes 5 or more days each week. Do not use any products that contain nicotine or tobacco. These products include cigarettes, chewing tobacco, and vaping devices, such as e-cigarettes. If you need help quitting, ask your health care provider. Do not use drugs. If you are sexually active, practice safe sex. Use a condom or other form of protection to prevent STIs.  Take aspirin only as told by your health care provider. Make sure that you understand how much to take and what form to take. Work with your health care provider to find out whether it is safe and beneficial for you to take  aspirin daily. Find healthy ways to manage stress, such as: Meditation, yoga, or listening to music. Journaling. Talking to a trusted person. Spending time with friends and family. Minimize exposure to UV radiation to reduce your risk of skin cancer. Safety Always wear your seat belt while driving or riding in a vehicle. Do not drive: If you have been drinking alcohol. Do not ride with someone who has been drinking. When you are tired or distracted. While texting. If you have been using any mind-altering substances or drugs. Wear a helmet and other protective equipment during sports activities. If you have firearms in your house, make sure you follow all gun safety procedures. What's next? Go to your health care provider once a year for an annual wellness visit. Ask your health care provider how often you should have your eyes and teeth checked. Stay up to date on all vaccines. This information is not intended to replace advice given to you by your health care provider. Make sure you discuss any questions you have with your health care provider. Document Revised: 09/22/2020 Document Reviewed: 09/22/2020 Elsevier Patient Education  [place].

## 2021-05-09 NOTE — Progress Notes (Signed)
Subjective:    Patient ID: Michael Lewis, male    DOB: [birth year], 56 y.o.   MRN: [id number]  HPI Pt in for cpe. He not  fasting.   Pt works for [employer].  Pt not exercising regularly. Pt non smoker. Rare alcohol use. Pt states healthy diet.   Has had 2 covid vaccines and 2 boosters.  Up to date on colonoscopy.    Review of Systems  Constitutional:  Negative for chills, fatigue and fever.  HENT:  Negative for congestion.   Respiratory:  Negative for cough, chest tightness, shortness of breath and wheezing.   Cardiovascular:  Negative for chest pain and palpitations.  Gastrointestinal:  Negative for abdominal pain, constipation, diarrhea and nausea.  Genitourinary:  Negative for difficulty urinating, enuresis and flank pain.  Musculoskeletal:  Negative for arthralgias, back pain, joint swelling and neck pain.  Skin:  Negative for rash.  Neurological:  Negative for seizures, syncope, facial asymmetry, weakness and light-headedness.  Hematological:  Negative for adenopathy. Does not bruise/bleed easily.  Psychiatric/Behavioral:  Negative for behavioral problems.    Past Medical History:  Diagnosis Date   Allergy    Asthma    as a child, no problems as adult, no inhaler   Hematuria 11/26/2013     Social History   Socioeconomic History   Marital status: Married    Spouse name: Not on file   Number of children: Not on file   Years of education: Not on file   Highest education level: Not on file  Occupational History   Occupation: Driver/Warehouse  Tobacco Use   Smoking status: Former    Packs/day: 1.00    Years: 25.00    Pack years: 25.00    Types: Cigarettes    Quit date: 01/08/2008    Years since quitting: 13.3   Smokeless tobacco: Never  Vaping Use   Vaping Use: Never used  Substance and Sexual Activity   Alcohol use: No    Alcohol/week: 0.0 standard drinks   Drug use: No   Sexual activity: Yes  Other Topics Concern   Not on file  Social  History Narrative   Married. Education: [employer]. Exercise: No.   Social Determinants of Health   Financial Resource Strain: Not on file  Food Insecurity: Not on file  Transportation Needs: Not on file  Physical Activity: Not on file  Stress: Not on file  Social Connections: Not on file  Intimate Partner Violence: Not on file    Past Surgical History:  Procedure Laterality Date   NASAL SEPTUM SURGERY     x 2   TONSILLECTOMY     WISDOM TOOTH EXTRACTION      Family History  Problem Relation Age of Onset   Alzheimer's disease Maternal Grandfather    Arthritis Paternal Grandmother    Heart disease Maternal Grandmother    COPD Father    Stroke Maternal Aunt        MGA   Colon cancer Neg Hx    Rectal cancer Neg Hx    Stomach cancer Neg Hx    Esophageal cancer Neg Hx     No Known Allergies  No current outpatient medications on file prior to visit.   No current facility-administered medications on file prior to visit.    BP 111/60    Pulse 70    Resp 18    Ht 5\' 8"  (1.727 m)    Wt 167 lb (75.8 kg)    SpO2 98%  BMI 25.39 kg/m       Objective:   Physical Exam   General Mental Status- Alert. General Appearance- Not in acute distress.   Skin General: Color- Normal Color. Moisture- Normal Moisture.  Neck Carotid Arteries- Normal color. Moisture- Normal Moisture. No carotid bruits. No JVD.  Chest and Lung Exam Auscultation: Breath Sounds:-Normal.  Cardiovascular Auscultation:Rythm- Regular. Murmurs & Other Heart Sounds:Auscultation of the heart reveals- No Murmurs.  Abdomen Inspection:-Inspeection Normal. Palpation/Percussion:Note:No mass. Palpation and Percussion of the abdomen reveal- Non Tender, Non Distended + BS, no rebound or guarding.  Neurologic Cranial Nerve exam:- CN III-XII intact(No nystagmus), symmetric smile. Strength:- 5/5 equal and symmetric strength both upper and lower extremities.      Assessment & Plan:   Patient Instructions   For you wellness exam today I have ordered cbc, cmp and  lipid panel. [facility].  Vaccine up to date except shingles. Recommend ask your insurance if they cover. If so call and see if we can give or if we recommend pharmacy.  Recommend exercise and healthy diet.  We will let you know lab results as they come in.   Follow up date appointment will be determined after lab review.      Mackie Pai, PA-C

## 2021-05-10 LAB — CBC WITH DIFFERENTIAL/PLATELET
Basophils Absolute: 0 10*3/uL (ref 0.0–0.1)
Basophils Relative: 0.5 % (ref 0.0–3.0)
Eosinophils Absolute: 0.1 10*3/uL (ref 0.0–0.7)
Eosinophils Relative: 1.4 % (ref 0.0–5.0)
HCT: 39.1 % (ref 39.0–52.0)
Hemoglobin: 13.3 g/dL (ref 13.0–17.0)
Lymphocytes Relative: 29.4 % (ref 12.0–46.0)
Lymphs Abs: 1.9 10*3/uL (ref 0.7–4.0)
MCHC: 34 g/dL (ref 30.0–36.0)
MCV: 90.7 fl (ref 78.0–100.0)
Monocytes Absolute: 0.6 10*3/uL (ref 0.1–1.0)
Monocytes Relative: 8.9 % (ref 3.0–12.0)
Neutro Abs: 3.9 10*3/uL (ref 1.4–7.7)
Neutrophils Relative %: 59.8 % (ref 43.0–77.0)
Platelets: 366 10*3/uL (ref 150.0–400.0)
RBC: 4.31 Mil/uL (ref 4.22–5.81)
RDW: 12.4 % (ref 11.5–15.5)
WBC: 6.6 10*3/uL (ref 4.0–10.5)

## 2021-05-10 LAB — COMPREHENSIVE METABOLIC PANEL
ALT: 15 U/L (ref 0–53)
AST: 16 U/L (ref 0–37)
Albumin: 3.9 g/dL (ref 3.5–5.2)
Alkaline Phosphatase: 59 U/L (ref 39–117)
BUN: 13 mg/dL (ref 6–23)
CO2: 29 mEq/L (ref 19–32)
Calcium: 8.7 mg/dL (ref 8.4–10.5)
Chloride: 108 mEq/L (ref 96–112)
Creatinine, Ser: 1.16 mg/dL (ref 0.40–1.50)
GFR: 71.7 mL/min (ref >60.00)
Glucose, Bld: 67 mg/dL — ABNORMAL LOW (ref 70–99)
Potassium: 3.7 mEq/L (ref 3.5–5.1)
Sodium: 143 mEq/L (ref 135–145)
Total Bilirubin: 0.5 mg/dL (ref 0.2–1.2)
Total Protein: 6.2 g/dL (ref 6.0–8.3)

## 2021-05-10 LAB — HEMOGLOBIN A1C: Hgb A1c MFr Bld: 5.9 % (ref 4.6–6.5)

## 2021-05-10 LAB — PSA: PSA: 0.67 ng/mL (ref 0.10–4.00)

## 2021-05-10 LAB — LIPID PANEL
Cholesterol: 139 mg/dL (ref 0–200)
HDL: 46 mg/dL (ref >39.00)
LDL Cholesterol: 67 mg/dL (ref 0–99)
NonHDL: 93.19
Total CHOL/HDL Ratio: 3
Triglycerides: 131 mg/dL (ref 0.0–149.0)
VLDL: 26.2 mg/dL (ref 0.0–40.0)

## 2021-05-27 NOTE — Addendum Note (Signed)
Encounter addended by: Annie Paras on: 05/27/2021 10:22 AM  Actions taken: Letter saved

## 2021-08-01 ENCOUNTER — Ambulatory Visit (INDEPENDENT_AMBULATORY_CARE_PROVIDER_SITE_OTHER): Payer: BC Managed Care – PPO | Admitting: Family Medicine

## 2021-08-01 ENCOUNTER — Encounter: Payer: Self-pay | Admitting: Family Medicine

## 2021-08-01 VITALS — BP 108/80 | HR 76 | Temp 98.0°F | Resp 18 | Ht 68.0 in | Wt 168.8 lb

## 2021-08-01 DIAGNOSIS — U071 COVID-19: Secondary | ICD-10-CM | POA: Diagnosis not present

## 2021-08-01 LAB — POC COVID19 BINAXNOW: SARS Coronavirus 2 Ag: POSITIVE — AB

## 2021-08-01 MED ORDER — NIRMATRELVIR/RITONAVIR (PAXLOVID)TABLET: 3.0000 | ORAL_TABLET | Freq: Two times a day (BID) | ORAL | 0 refills | Status: AC

## 2021-08-01 NOTE — Patient Instructions (Signed)
COVID-19: Quarantine and Isolation ?Quarantine ?If you were exposed ?Quarantine and stay away from others when you have been in close contact with someone who has COVID-19. ?Isolate ?If you are sick or test positive ?Isolate when you are sick or when you have COVID-19, even if you don't have symptoms. ?When to stay home ?Calculating quarantine ?The date of your exposure is considered day 0. Day 1 is the first full day after your last contact with a person who has had COVID-19. Stay home and away from other people for at least 5 days. Learn why CDC updated guidance for the general public. ?IF YOU were exposed to COVID-19 and are NOT  ?up to dateIF YOU were exposed to COVID-19 and are NOT on COVID-19 vaccinations ?Quarantine for at least 5 days ?Stay home ?Stay home and quarantine for at least 5 full days. ?Wear a well-fitting mask if you must be around others in your home. ?Do not travel. ?Get tested ?Even if you don't develop symptoms, get tested at least 5 days after you last had close contact with someone with COVID-19. ?After quarantine ?Watch for symptoms ?Watch for symptoms until 10 days after you last had close contact with someone with COVID-19. ?Avoid travel ?It is best to avoid travel until a full 10 days after you last had close contact with someone with COVID-19. ?If you develop symptoms ?Isolate immediately and get tested. Continue to stay home until you know the results. Wear a well-fitting mask around others. ?Take precautions until day 10 ?Wear a well-fitting mask ?Wear a well-fitting mask for 10 full days any time you are around others inside your home or in public. Do not go to places where you are unable to wear a well-fitting mask. ?If you must travel during days 6-10, take precautions. ?Avoid being around people who are more likely to get very sick from COVID-19. ?IF YOU were exposed to COVID-19 and are  ?up to dateIF YOU were exposed to COVID-19 and are on COVID-19 vaccinations ?No  quarantine ?You do not need to stay home unless you develop symptoms. ?Get tested ?Even if you don't develop symptoms, get tested at least 5 days after you last had close contact with someone with COVID-19. ?Watch for symptoms ?Watch for symptoms until 10 days after you last had close contact with someone with COVID-19. ?If you develop symptoms ?Isolate immediately and get tested. Continue to stay home until you know the results. Wear a well-fitting mask around others. ?Take precautions until day 10 ?Wear a well-fitting mask ?Wear a well-fitting mask for 10 full days any time you are around others inside your home or in public. Do not go to places where you are unable to wear a well-fitting mask. ?Take precautions if traveling ?Avoid being around people who are more likely to get very sick from COVID-19. ?IF YOU were exposed to COVID-19 and had confirmed COVID-19 within the past 90 days (you tested positive using a viral test) ?No quarantine ?You do not need to stay home unless you develop symptoms. ?Watch for symptoms ?Watch for symptoms until 10 days after you last had close contact with someone with COVID-19. ?If you develop symptoms ?Isolate immediately and get tested. Continue to stay home until you know the results. Wear a well-fitting mask around others. ?Take precautions until day 10 ?Wear a well-fitting mask ?Wear a well-fitting mask for 10 full days any time you are around others inside your home or in public. Do not go to places where you are  unable to wear a well-fitting mask. ?Take precautions if traveling ?Avoid being around people who are more likely to get very sick from COVID-19. ?Calculating isolation ?Day 0 is your first day of symptoms or a positive viral test. Day 1 is the first full day after your symptoms developed or your test specimen was collected. If you have COVID-19 or have symptoms, isolate for at least 5 days. ?IF YOU tested positive for COVID-19 or have symptoms, regardless of  vaccination status ?Stay home for at least 5 days ?Stay home for 5 days and isolate from others in your home. ?Wear a well-fitting mask if you must be around others in your home. ?Do not travel. ?Ending isolation if you had symptoms ?End isolation after 5 full days if you are fever-free for 24 hours (without the use of fever-reducing medication) and your symptoms are improving. ?Ending isolation if you did NOT have symptoms ?End isolation after at least 5 full days after your positive test. ?If you got very sick from COVID-19 or have a weakened immune system ?You should isolate for at least 10 days. Consult your doctor before ending isolation. ?Take precautions until day 10 ?Wear a well-fitting mask ?Wear a well-fitting mask for 10 full days any time you are around others inside your home or in public. Do not go to places where you are unable to wear a well-fitting mask. ?Do not travel ?Do not travel until a full 10 days after your symptoms started or the date your positive test was taken if you had no symptoms. ?Avoid being around people who are more likely to get very sick from COVID-19. ?Definitions ?Exposure ?Contact with someone infected with SARS-CoV-2, the virus that causes COVID-19, in a way that increases the likelihood of getting infected with the virus. ?Close contact ?A close contact is someone who was less than 6 feet away from an infected person (laboratory-confirmed or a clinical diagnosis) for a cumulative total of 15 minutes or more over a 24-hour period. For example, three individual 5-minute exposures for a total of 15 minutes. People who are exposed to someone with COVID-19 after they completed at least 5 days of isolation are not considered close contacts. ?Quarantine ?[place] is a strategy used to prevent transmission of COVID-19 by keeping people who have been in close contact with someone with COVID-19 apart from others. ?Who does not need to quarantine? ?If you had close contact with  someone with COVID-19 and you are in one of the following groups, you do not need to quarantine. ?You are up to date with your COVID-19 vaccines. ?You had confirmed COVID-19 within the last 90 days (meaning you tested positive using a viral test). ?If you are up to date with COVID-19 vaccines, you should wear a well-fitting mask around others for 10 days from the date of your last close contact with someone with COVID-19 (the date of last close contact is considered day 0). Get tested at least 5 days after you last had close contact with someone with COVID-19. If you test positive or develop COVID-19 symptoms, isolate from other people and follow recommendations in the Isolation section below. If you tested positive for COVID-19 with a viral test within the previous 90 days and subsequently recovered and remain without COVID-19 symptoms, you do not need to quarantine or get tested after close contact. You should wear a well-fitting mask around others for 10 days from the date of your last close contact with someone with COVID-19 (the date of last  close contact is considered day 0). If you have COVID-19 symptoms, get tested and isolate from other people and follow recommendations in the Isolation section below. ?Who should quarantine? ?If you come into close contact with someone with COVID-19, you should quarantine if you are not up to date on COVID-19 vaccines. This includes people who are not vaccinated. ?What to do for quarantine ?Stay home and away from other people for at least 5 days (day 0 through day 5) after your last contact with a person who has COVID-19. The date of your exposure is considered day 0. Wear a well-fitting mask when around others at home, if possible. ?For 10 days after your last close contact with someone with COVID-19, watch for fever (100.4?F or greater), cough, shortness of breath, or other COVID-19 symptoms. ?If you develop symptoms, get tested immediately and isolate until you receive  your test results. If you test positive, follow isolation recommendations. ?If you do not develop symptoms, get tested at least 5 days after you last had close contact with someone with COVID-19. ?If you test negative, you c

## 2021-08-01 NOTE — Assessment & Plan Note (Signed)
covid + ?paxlovid sent to pharmacy ?con't with otc flonase, antihistamine  ?Cough med sent in  ?Call or rto prn ?Pt may go back to work after 5 days as long as congestion improves but wear a mask for 5 more days  ?

## 2021-08-01 NOTE — Progress Notes (Addendum)
? ?Subjective:  ? ?By signing my name below, I, Carylon Perches, attest that this documentation has been prepared under the direction and in the presence of Roma Schanz DO, 08/01/2021   ? ? Patient ID: Michael Lewis, male    DOB: [birth year], 56 y.o.   MRN: [id number] ? ?Chief Complaint  ?Patient presents with  ? Covid Positive  ?  Sxs started late Saturday night and had two positive COVID tests at home.   ? ? ?HPI ?Patient is in today for an office visit. ? ?He is requesting for a COVID 19 swab for occupation purposes. He took an at - home COVID 19 test and tested positive. He has symptoms of sweating, coughing, mild body aches and some congestion. He had a fever but it was resolved on 07/31/2021. He has been taking Tylenol and Mucinex for his symptoms.  ? ?Past Medical History:  ?Diagnosis Date  ? Allergy   ? Asthma   ? as a child, no problems as adult, no inhaler  ? Hematuria 11/26/2013  ? ? ?Past Surgical History:  ?Procedure Laterality Date  ? NASAL SEPTUM SURGERY    ? x 2  ? TONSILLECTOMY    ? WISDOM TOOTH EXTRACTION    ? ? ?Family History  ?Problem Relation Age of Onset  ? Alzheimer's disease Maternal Grandfather   ? Arthritis Paternal Grandmother   ? Heart disease Maternal Grandmother   ? COPD Father   ? Stroke Maternal Aunt   ?     MGA  ? Colon cancer Neg Hx   ? Rectal cancer Neg Hx   ? Stomach cancer Neg Hx   ? Esophageal cancer Neg Hx   ? ? ?Social History  ? ?Socioeconomic History  ? Marital status: Married  ?  Spouse name: Not on file  ? Number of children: Not on file  ? Years of education: Not on file  ? Highest education level: Not on file  ?Occupational History  ? Occupation: Driver/Warehouse  ?Tobacco Use  ? Smoking status: Former  ?  Packs/day: 1.00  ?  Years: 25.00  ?  Pack years: 25.00  ?  Types: Cigarettes  ?  Quit date: 01/08/2008  ?  Years since quitting: 13.5  ? Smokeless tobacco: Never  ?Vaping Use  ? Vaping Use: Never used  ?Substance and Sexual Activity  ? Alcohol use: No  ?   Alcohol/week: 0.0 standard drinks  ? Drug use: No  ? Sexual activity: Yes  ?Other Topics Concern  ? Not on file  ?Social History Narrative  ? Married. Education: [employer]. Exercise: No.  ? ?Social Determinants of Health  ? ?Financial Resource Strain: Not on file  ?Food Insecurity: Not on file  ?Transportation Needs: Not on file  ?Physical Activity: Not on file  ?Stress: Not on file  ?Social Connections: Not on file  ?Intimate Partner Violence: Not on file  ? ? ?No outpatient medications prior to visit.  ? ?No facility-administered medications prior to visit.  ? ? ?No Known Allergies ? ?Review of Systems  ?Constitutional:  Positive for diaphoresis.  ?     (+) Body Aches  ?HENT:  Positive for congestion.   ?Respiratory:  Positive for cough.   ? ?   ?Objective:  ?  ?Physical Exam ?Constitutional:   ?   General: He is not in acute distress. ?   Appearance: Normal appearance. He is well-developed. He is not ill-appearing.  ?HENT:  ?   Head: Normocephalic and atraumatic.  ?  Right Ear: External ear normal.  ?   Left Ear: External ear normal.  ?   Nose:  ?   Right Turbinates: Enlarged and swollen.  ?   Left Turbinates: Enlarged and swollen.  ?   Right Sinus: Maxillary sinus tenderness and frontal sinus tenderness present.  ?   Left Sinus: Maxillary sinus tenderness and frontal sinus tenderness present.  ?Eyes:  ?   General:     ?   Right eye: No discharge.     ?   Left eye: No discharge.  ?   Extraocular Movements: Extraocular movements intact.  ?   Conjunctiva/sclera: Conjunctivae normal.  ?   Pupils: Pupils are equal, round, and reactive to light.  ?Cardiovascular:  ?   Rate and Rhythm: Normal rate and regular rhythm.  ?   Heart sounds: Normal heart sounds. No murmur heard. ?  No gallop.  ?Pulmonary:  ?   Effort: Pulmonary effort is normal. No respiratory distress.  ?   Breath sounds: Normal breath sounds. No wheezing or rales.  ?Chest:  ?   Chest wall: No tenderness.  ?Lymphadenopathy:  ?   Cervical: Cervical  adenopathy present.  ?Skin: ?   General: Skin is warm and dry.  ?Neurological:  ?   Mental Status: He is alert and oriented to person, place, and time.  ?Psychiatric:     ?   Judgment: Judgment normal.  ? ? ?BP 108/80 (BP Location: Left Arm, Patient Position: Sitting, Cuff Size: Normal)   Pulse 76   Temp 98 ?F (36.7 ?C) (Oral)   Resp 18   Ht '5\' 8"'$  (1.727 m)   Wt 168 lb 12.8 oz (76.6 kg)   SpO2 96%   BMI 25.67 kg/m?  ?Wt Readings from Last 3 Encounters:  ?08/01/21 168 lb 12.8 oz (76.6 kg)  ?05/09/21 167 lb (75.8 kg)  ?11/16/20 171 lb (77.6 kg)  ? ? ?Diabetic Foot Exam - Simple   ?No data filed ?  ? ?Lab Results  ?Component Value Date  ? WBC 6.6 05/09/2021  ? HGB 13.3 05/09/2021  ? HCT 39.1 05/09/2021  ? PLT 366.0 05/09/2021  ? GLUCOSE 67 (L) 05/09/2021  ? CHOL 139 05/09/2021  ? TRIG 131.0 05/09/2021  ? HDL 46.00 05/09/2021  ? [place] 67 05/09/2021  ? ALT 15 05/09/2021  ? AST 16 05/09/2021  ? NA 143 05/09/2021  ? K 3.7 05/09/2021  ? CL 108 05/09/2021  ? CREATININE 1.16 05/09/2021  ? BUN 13 05/09/2021  ? CO2 29 05/09/2021  ? TSH 1.57 04/08/2013  ? PSA 0.67 05/09/2021  ? HGBA1C 5.9 05/09/2021  ? ? ?Lab Results  ?Component Value Date  ? TSH 1.57 04/08/2013  ? ?Lab Results  ?Component Value Date  ? WBC 6.6 05/09/2021  ? HGB 13.3 05/09/2021  ? HCT 39.1 05/09/2021  ? MCV 90.7 05/09/2021  ? PLT 366.0 05/09/2021  ? ?Lab Results  ?Component Value Date  ? NA 143 05/09/2021  ? K 3.7 05/09/2021  ? CO2 29 05/09/2021  ? GLUCOSE 67 (L) 05/09/2021  ? BUN 13 05/09/2021  ? CREATININE 1.16 05/09/2021  ? BILITOT 0.5 05/09/2021  ? ALKPHOS 59 05/09/2021  ? AST 16 05/09/2021  ? ALT 15 05/09/2021  ? PROT 6.2 05/09/2021  ? ALBUMIN 3.9 05/09/2021  ? CALCIUM 8.7 05/09/2021  ? GFR 71.70 05/09/2021  ? ?Lab Results  ?Component Value Date  ? CHOL 139 05/09/2021  ? ?Lab Results  ?Component Value Date  ? HDL 46.00 05/09/2021  ? ?  Lab Results  ?Component Value Date  ? [place] 67 05/09/2021  ? ?Lab Results  ?Component Value Date  ? TRIG 131.0  05/09/2021  ? ?Lab Results  ?Component Value Date  ? CHOLHDL 3 05/09/2021  ? ?Lab Results  ?Component Value Date  ? HGBA1C 5.9 05/09/2021  ? ? ?   ?Assessment & Plan:  ? ?Problem List Items Addressed This Visit   ? ?  ? Unprioritized  ? COVID-19 - Primary  ?  covid + ?paxlovid sent to pharmacy ?con't with otc flonase, antihistamine  ?Cough med sent in  ?Call or rto prn ?Pt may go back to work after 5 days as long as congestion improves but wear a mask for 5 more days  ? ?  ?  ? Relevant Medications  ? nirmatrelvir/ritonavir EUA (PAXLOVID) 20 x 150 MG & 10 x '100MG'$  TABS  ? Other Relevant Orders  ? POC COVID-19 (Completed)  ? ? ? ? ?Meds ordered this encounter  ?Medications  ? nirmatrelvir/ritonavir EUA (PAXLOVID) 20 x 150 MG & 10 x '100MG'$  TABS  ?  Sig: Take 3 tablets by mouth 2 (two) times daily for 5 days. (Take nirmatrelvir 150 mg two tablets twice daily for 5 days and ritonavir 100 mg one tablet twice daily for 5 days) Patient GFR is 71  ?  Dispense:  30 tablet  ?  Refill:  0  ? ? ?I, Ann Held, DO, personally preformed the services described in this documentation.  All medical record entries made by the scribe were at my direction and in my presence.  I have reviewed the chart and discharge instructions (if applicable) and agree that the record reflects my personal performance and is accurate and complete. 08/01/2021 ? ? ?I,Amber Collins,acting as a Education administrator for [employer], DO.,have documented all relevant documentation on the behalf of Ann Held, DO,as directed by  Ann Held, DO while in the presence of Ann Held, DO. ? ? ? ?Ann Held, DO ? ?

## 2022-04-26 ENCOUNTER — Encounter: Payer: Self-pay | Admitting: Gastroenterology

## 2022-05-04 ENCOUNTER — Encounter: Payer: Self-pay | Admitting: Gastroenterology

## 2022-06-26 ENCOUNTER — Ambulatory Visit (AMBULATORY_SURGERY_CENTER): Payer: BC Managed Care – PPO

## 2022-06-26 VITALS — Ht 68.0 in | Wt 170.0 lb

## 2022-06-26 DIAGNOSIS — Z86010 Personal history of colonic polyps: Secondary | ICD-10-CM

## 2022-06-26 MED ORDER — NA SULFATE-K SULFATE-MG SULF 17.5-3.13-1.6 GM/177ML PO SOLN: 1.0000 | Freq: Once | ORAL | 0 refills | Status: AC

## 2022-06-26 NOTE — Progress Notes (Signed)
Pre visit completed via phone call; Patient verified name, DOB, and address;  No egg or soy allergy known to patient  No issues known to pt with past sedation with any surgeries or procedures Patient denies ever being told they had issues or difficulty with intubation  No FH of Malignant Hyperthermia Pt is not on diet pills Pt is not on home 02  Pt is not on blood thinners  Pt denies issues with constipation;  No A fib or A flutter Have any cardiac testing pending--NO Pt instructed to use [email or web address] or GoodRx for a price reduction on prep   Insurance verified during [place] appt=BCBS  PPO  Patient's chart reviewed by Osvaldo Angst CNRA prior to previsit and patient appropriate for the [place].  Previsit completed and red dot placed by patient's name on their procedure day (on provider's schedule).    Instructions and GoodRx coupon printed and mailed to patient per his request;

## 2022-06-28 ENCOUNTER — Encounter: Payer: Self-pay | Admitting: Gastroenterology

## 2022-07-14 ENCOUNTER — Encounter: Payer: BC Managed Care – PPO | Admitting: Gastroenterology

## 2022-08-26 ENCOUNTER — Encounter: Payer: Self-pay | Admitting: Certified Registered Nurse Anesthetist

## 2022-08-29 ENCOUNTER — Encounter: Payer: Self-pay | Admitting: Gastroenterology

## 2022-08-29 ENCOUNTER — Ambulatory Visit (AMBULATORY_SURGERY_CENTER): Payer: BC Managed Care – PPO | Admitting: Gastroenterology

## 2022-08-29 VITALS — BP 122/64 | HR 48 | Temp 98.0°F | Resp 10 | Ht 68.0 in | Wt 170.0 lb

## 2022-08-29 DIAGNOSIS — D120 Benign neoplasm of cecum: Secondary | ICD-10-CM | POA: Diagnosis not present

## 2022-08-29 DIAGNOSIS — Z86010 Personal history of colonic polyps: Secondary | ICD-10-CM

## 2022-08-29 DIAGNOSIS — D125 Benign neoplasm of sigmoid colon: Secondary | ICD-10-CM

## 2022-08-29 DIAGNOSIS — Z1211 Encounter for screening for malignant neoplasm of colon: Secondary | ICD-10-CM | POA: Diagnosis not present

## 2022-08-29 DIAGNOSIS — D124 Benign neoplasm of descending colon: Secondary | ICD-10-CM | POA: Diagnosis not present

## 2022-08-29 DIAGNOSIS — K635 Polyp of colon: Secondary | ICD-10-CM | POA: Diagnosis not present

## 2022-08-29 DIAGNOSIS — Z09 Encounter for follow-up examination after completed treatment for conditions other than malignant neoplasm: Secondary | ICD-10-CM | POA: Diagnosis not present

## 2022-08-29 MED ORDER — SODIUM CHLORIDE 0.9 % IV SOLN: 500.0000 mL | Freq: Once | INTRAVENOUS | Status: DC

## 2022-08-29 NOTE — Progress Notes (Signed)
Called to room to assist during endoscopic procedure.  Patient ID and intended procedure confirmed with present staff. Received instructions for my participation in the procedure from the performing physician.  

## 2022-08-29 NOTE — Progress Notes (Signed)
See 08/02/2022 H&P, no changes 

## 2022-08-29 NOTE — Progress Notes (Signed)
Pt's states no medical or surgical changes since previsit or office visit. 

## 2022-08-29 NOTE — Patient Instructions (Signed)
Please read handouts provided. Continue present medications. Await pathology results. High Fiber Diet. No aspirin, ibuprofen, naproxen, or other non-steriodal anti-inflammatory drugs for 2 weeks.   YOU HAD AN ENDOSCOPIC PROCEDURE TODAY AT THE [facility]:   Refer to the procedure report that was given to you for any specific questions about what was found during the examination.  If the procedure report does not answer your questions, please call your gastroenterologist to clarify.  If you requested that your care partner not be given the details of your procedure findings, then the procedure report has been included in a sealed envelope for you to review at your convenience later.  YOU SHOULD EXPECT: Some feelings of bloating in the abdomen. Passage of more gas than usual.  Walking can help get rid of the air that was put into your GI tract during the procedure and reduce the bloating. If you had a lower endoscopy (such as a colonoscopy or flexible sigmoidoscopy) you may notice spotting of blood in your stool or on the toilet paper. If you underwent a bowel prep for your procedure, you may not have a normal bowel movement for a few days.  Please Note:  You might notice some irritation and congestion in your nose or some drainage.  This is from the oxygen used during your procedure.  There is no need for concern and it should clear up in a day or so.  SYMPTOMS TO REPORT IMMEDIATELY:  Following lower endoscopy (colonoscopy or flexible sigmoidoscopy):  Excessive amounts of blood in the stool  Significant tenderness or worsening of abdominal pains  Swelling of the abdomen that is new, acute  Fever of 100F or higher  For urgent or emergent issues, a gastroenterologist can be reached at any hour by calling [phone]. Do not use MyChart messaging for urgent concerns.    DIET:  We do recommend a small meal at first, but then you may proceed to your regular diet.  Drink plenty of  fluids but you should avoid alcoholic beverages for 24 hours.  ACTIVITY:  You should plan to take it easy for the rest of today and you should NOT DRIVE or use heavy machinery until tomorrow (because of the sedation medicines used during the test).    FOLLOW UP: Our staff will call the number listed on your records the next business day following your procedure.  We will call around 7:15- 8:00 am to check on you and address any questions or concerns that you may have regarding the information given to you following your procedure. If we do not reach you, we will leave a message.     If any biopsies were taken you will be contacted by phone or by letter within the next 1-3 weeks.  Please call us at [phone] if you have not heard about the biopsies in 3 weeks.    SIGNATURES/CONFIDENTIALITY: You and/or your care partner have signed paperwork which will be entered into your electronic medical record.  These signatures attest to the fact that that the information above on your After Visit Summary has been reviewed and is understood.  Full responsibility of the confidentiality of this discharge information lies with you and/or your care-partner. 

## 2022-08-29 NOTE — Progress Notes (Signed)
Report given to PACU, vss 

## 2022-08-29 NOTE — Op Note (Signed)
[facility] Patient Name: Michael Lewis Procedure Date: 08/29/2022 1:38 PM MRN: [id number] Endoscopist: Meryl Dare , MD, [id number] Age: 57 Referring MD:  Date of Birth: 1965/06/12 Gender: Male Account #: [id number] Procedure:                Colonoscopy Indications:              Surveillance: Personal history of adenomatous and                            sessile serrated polyps on last colonoscopy 3 years                            ago Medicines:                Monitored Anesthesia Care Procedure:                Pre-Anesthesia Assessment:                           - Prior to the procedure, a History and Physical                            was performed, and patient medications and                            allergies were reviewed. The patient's tolerance of                            previous anesthesia was also reviewed. The risks                            and benefits of the procedure and the sedation                            options and risks were discussed with the patient.                            All questions were answered, and informed consent                            was obtained. Prior Anticoagulants: The patient has                            taken no anticoagulant or antiplatelet agents. ASA                            Grade Assessment: II - A patient with mild systemic                            disease. After reviewing the risks and benefits,                            the patient was deemed in satisfactory condition to  undergo the procedure.                           After obtaining informed consent, the colonoscope                            was passed under direct vision. Throughout the                            procedure, the patient's blood pressure, pulse, and                            oxygen saturations were monitored continuously. The                            CF [id number] #[id number] was introduced through the anus                             and advanced to the the cecum, identified by                            appendiceal orifice and ileocecal valve. The                            ileocecal valve, appendiceal orifice, and rectum                            were photographed. The quality of the bowel                            preparation was good. The colonoscopy was performed                            without difficulty. The patient tolerated the                            procedure well. Scope In: 1:40:46 PM Scope Out: 2:00:31 PM Scope Withdrawal Time: 0 hours 17 minutes 2 seconds  Total Procedure Duration: 0 hours 19 minutes 45 seconds  Findings:                 The perianal and digital rectal examinations were                            normal.                           Four sessile polyps were found in the sigmoid colon                            (1), descending colon (2) and cecum (1). The polyps                            were 6 to 10 mm in size. These polyps were removed  with a cold snare. Resection and retrieval were                            complete.                           Multiple medium-mouthed and small-mouthed                            diverticula were found in the left colon. There was                            narrowing of the colon in association with the                            diverticular opening. Peri-diverticular erythema                            was seen. There was no evidence of diverticular                            bleeding.                           Internal hemorrhoids were found during                            retroflexion. The hemorrhoids were small and Grade                            I (internal hemorrhoids that do not prolapse).                           The exam was otherwise without abnormality on                            direct and retroflexion views. Complications:            No immediate complications. Estimated blood  loss:                            None. Estimated Blood Loss:     Estimated blood loss: none. Impression:               - Four 6 to 10 mm polyps in the sigmoid colon, in                            the descending colon and in the cecum, removed with                            a cold snare. Resected and retrieved.                           - Moderate diverticulosis in the left colon.                           -  Internal hemorrhoids.                           - The examination was otherwise normal on direct                            and retroflexion views. Recommendation:           - Repeat colonoscopy, likely 3 years, after studies                            are complete for surveillance based on pathology                            results.                           - Patient has a contact number available for                            emergencies. The signs and symptoms of potential                            delayed complications were discussed with the                            patient. Return to normal activities tomorrow.                            Written discharge instructions were provided to the                            patient.                           - Hgih fiber diet.                           - Continue present medications.                           - Await pathology results.                           - No aspirin, ibuprofen, naproxen, or other                            non-steroidal anti-inflammatory drugs for 2 weeks                            after polyp removal. Meryl Dare, MD 08/29/2022 2:05:26 PM This report has been signed electronically.

## 2022-08-30 ENCOUNTER — Telehealth: Payer: Self-pay | Admitting: *Deleted

## 2022-08-30 NOTE — Telephone Encounter (Signed)
  Follow up Call-     08/29/2022   12:33 PM  Call back number  Post procedure Call Back phone  # [phone]  Permission to leave phone message Yes     Patient questions:  Do you have a fever, pain , or abdominal swelling? No. Pain Score  0 *  Have you tolerated food without any problems? Yes.    Have you been able to return to your normal activities? Yes.    Do you have any questions about your discharge instructions: Diet   No. Medications  No. Follow up visit  No.  Do you have questions or concerns about your Care? No.  Actions: * If pain score is 4 or above: No action needed, pain <4.

## 2022-09-13 ENCOUNTER — Encounter: Payer: Self-pay | Admitting: Gastroenterology

## 2022-10-19 DIAGNOSIS — L820 Inflamed seborrheic keratosis: Secondary | ICD-10-CM | POA: Diagnosis not present

## 2022-10-19 DIAGNOSIS — L408 Other psoriasis: Secondary | ICD-10-CM | POA: Diagnosis not present

## 2022-10-19 DIAGNOSIS — D485 Neoplasm of uncertain behavior of skin: Secondary | ICD-10-CM | POA: Diagnosis not present

## 2023-05-30 IMAGING — CT CT CHEST LUNG CANCER SCREENING LOW DOSE W/O CM
1 series · 10 of 10 positions shown, 13 images · non-contrast
Comparison: None.

CLINICAL DATA: Lung cancer screening. Thirty-seven pack-year
history. Former asymptomatic smoker.

EXAM:
CT CHEST WITHOUT CONTRAST LOW-DOSE FOR LUNG CANCER SCREENING
TECHNIQUE: Multidetector CT imaging of the chest was performed following the
standard protocol without IV contrast.

[ct lung segmentation data · axial · 0.71mm/px · z∈[-362,-362]mm · 10 of 340 frames shown]
[frame 1/340  mediastinal]
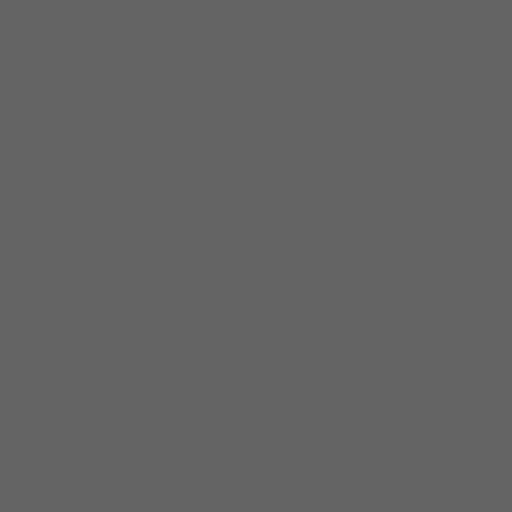
[frame 1/340  lung]
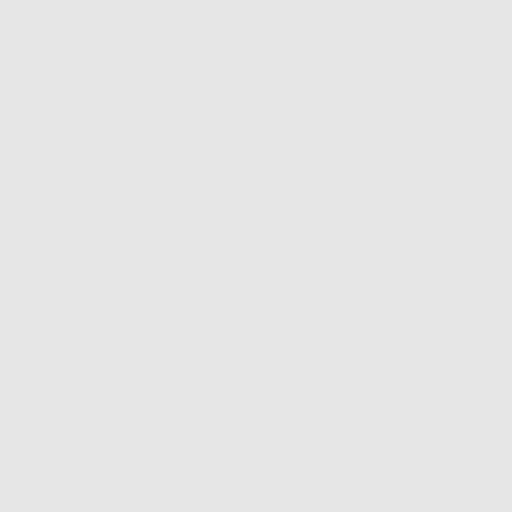
[frame 38/340  lung]
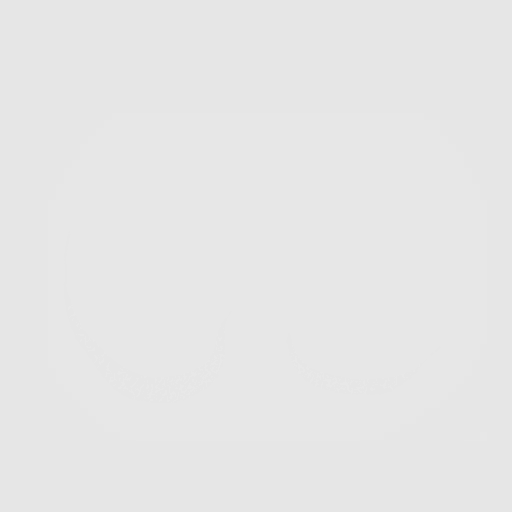
[frame 76/340  lung]
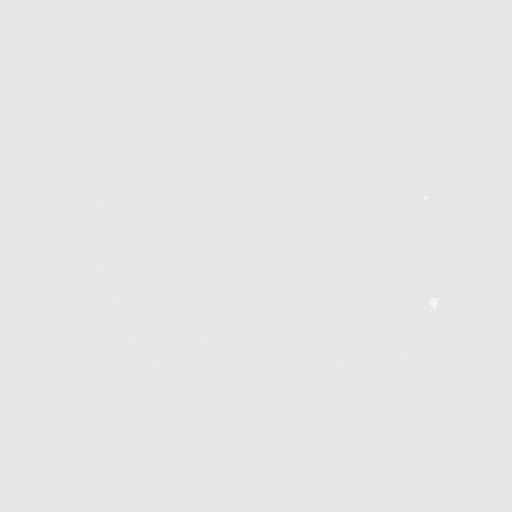
[frame 114/340  lung]
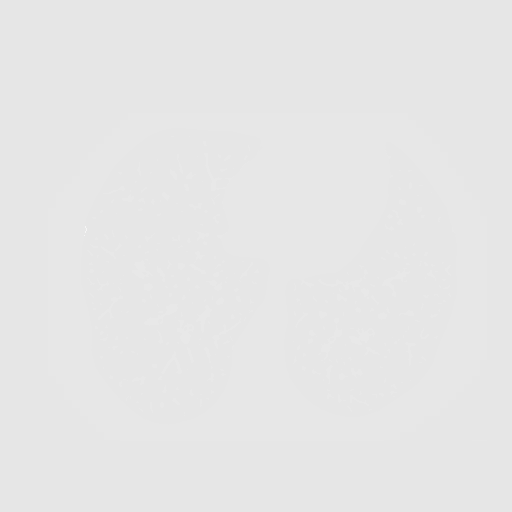
[frame 151/340  mediastinal]
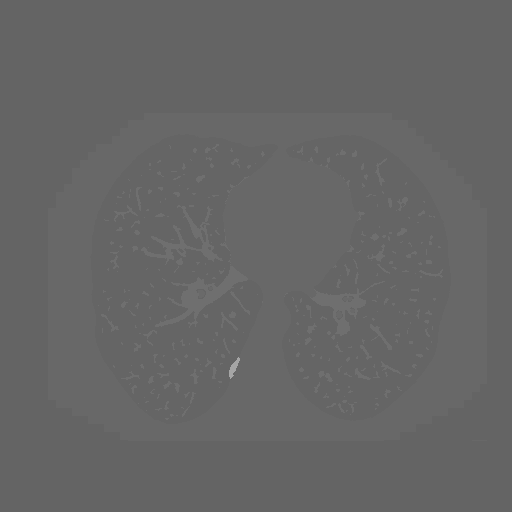
[frame 151/340  lung]
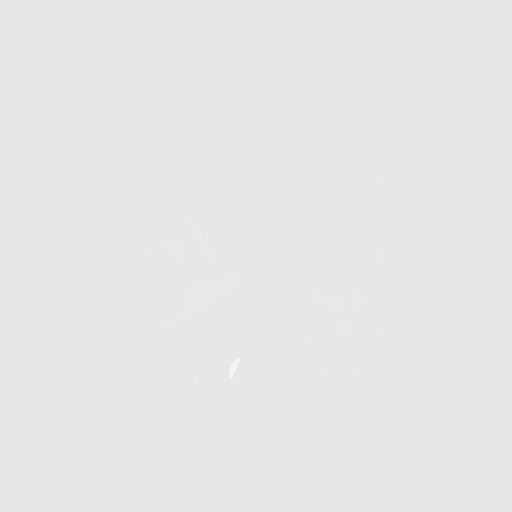
[frame 189/340  lung]
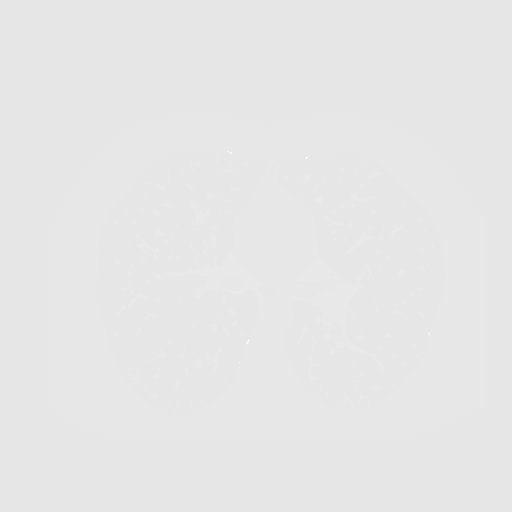
[frame 227/340  lung]
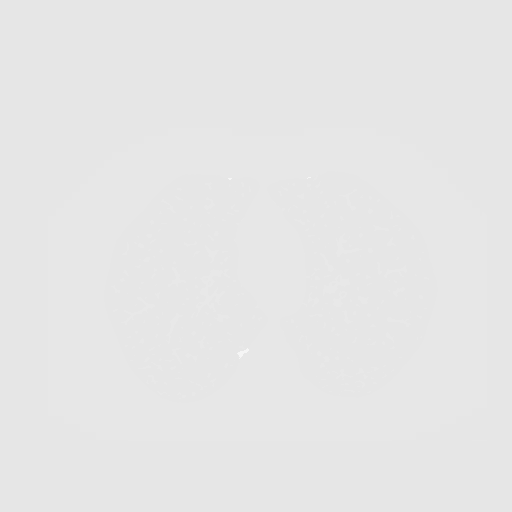
[frame 264/340  lung]
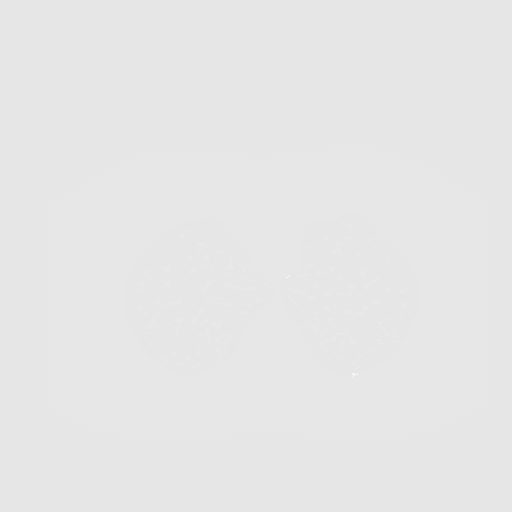
[frame 302/340  mediastinal]
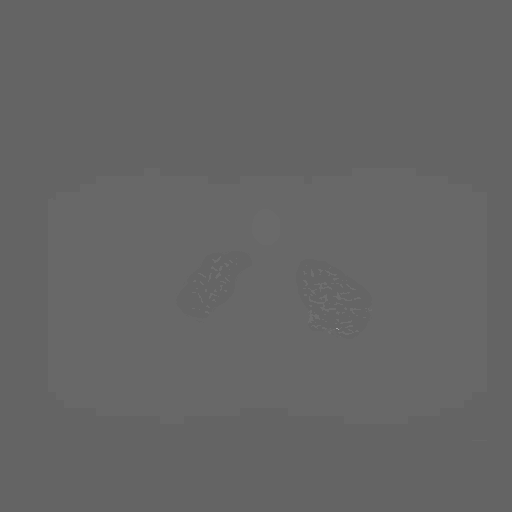
[frame 302/340  lung]
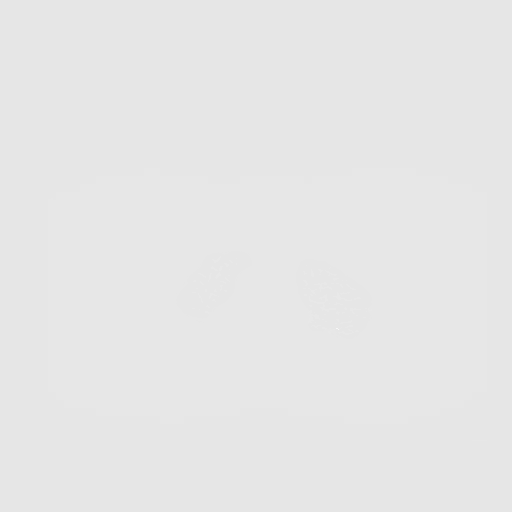
[frame 340/340  lung]
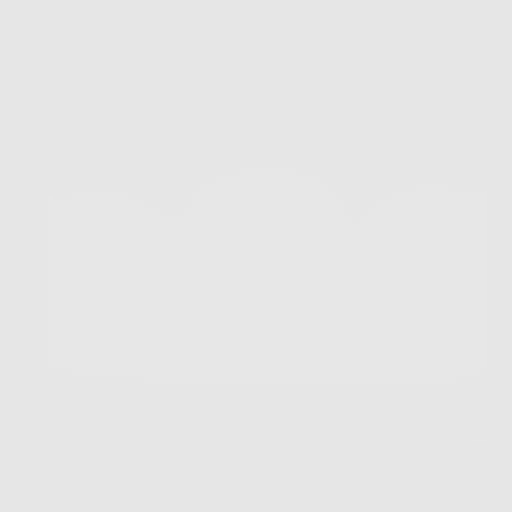

[10 of 10 positions shown; findings below may reference images not displayed]

FINDINGS: Cardiovascular: The heart size appears normal. No pericardial
effusion.

Mediastinum/Nodes: No enlarged mediastinal, hilar, or axillary lymph
nodes. Thyroid gland, trachea, and esophagus demonstrate no
significant findings.

Lungs/Pleura: Mild centrilobular and paraseptal emphysema with
diffuse bronchial wall thickening. No pleural effusion, airspace
consolidation, or atelectasis. A few scattered lung nodules are
identified. The largest of these is in the lateral left base with a
mean derived diameter of 7.7 mm.

Upper Abdomen: No acute abnormality.

Musculoskeletal: No chest wall mass or suspicious bone lesions
identified.
IMPRESSION: 1. Lung-RADS 3, probably benign findings. Short-term follow-up in 6
months is recommended with repeat low-dose chest CT without contrast
(please use the following order, "CT CHEST LCS NODULE FOLLOW-UP W/O
CM").
2. Diffuse bronchial wall thickening with emphysema, as above;
imaging findings suggestive of underlying COPD.

Emphysema ([id number]).

## 2023-05-30 IMAGING — US US THYROID
1 series · 14 of 25 positions shown · non-contrast
Comparison: None.

CLINICAL DATA: Thyromegaly

EXAM:
THYROID ULTRASOUND
TECHNIQUE: Ultrasound examination of the thyroid gland and adjacent soft
tissues was performed.

[Series 1: us thyroid · 14 of 25 slices shown]
[im 1/25]
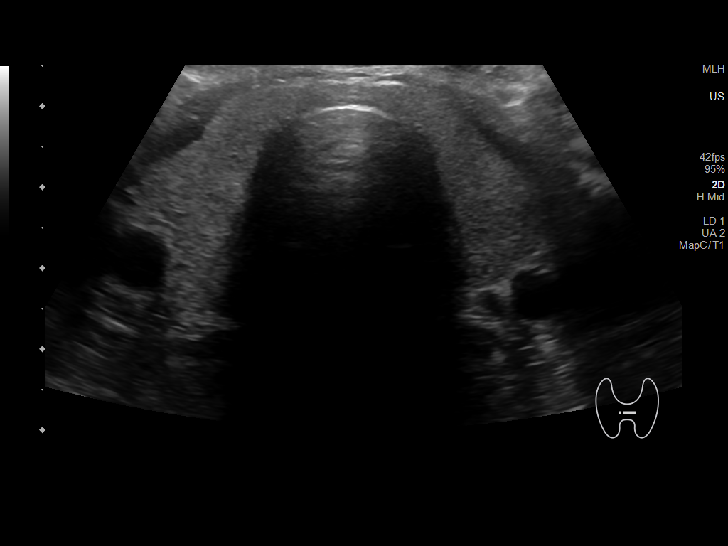
[im 3/25]
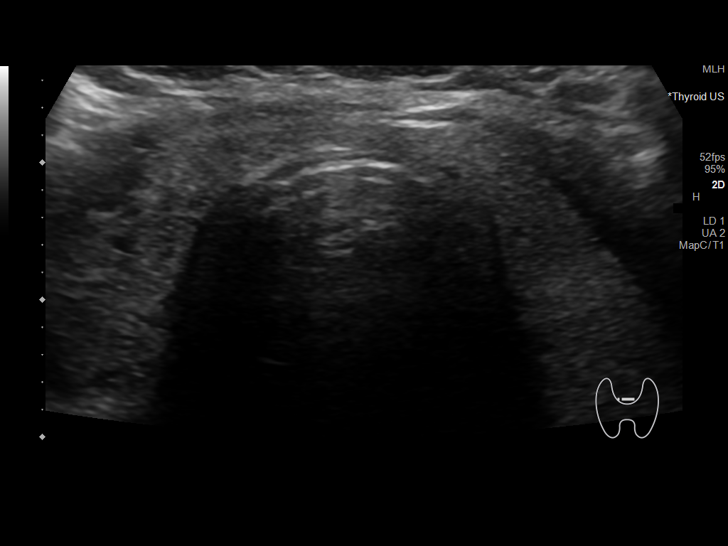
[im 5/25]
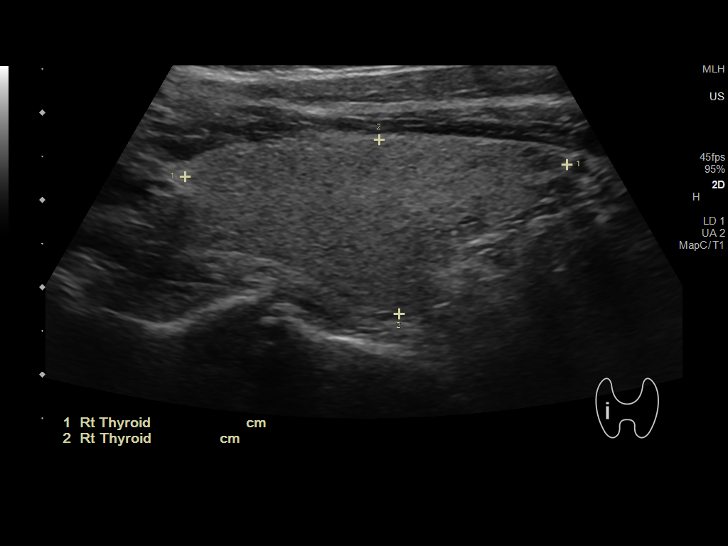
[im 7/25]
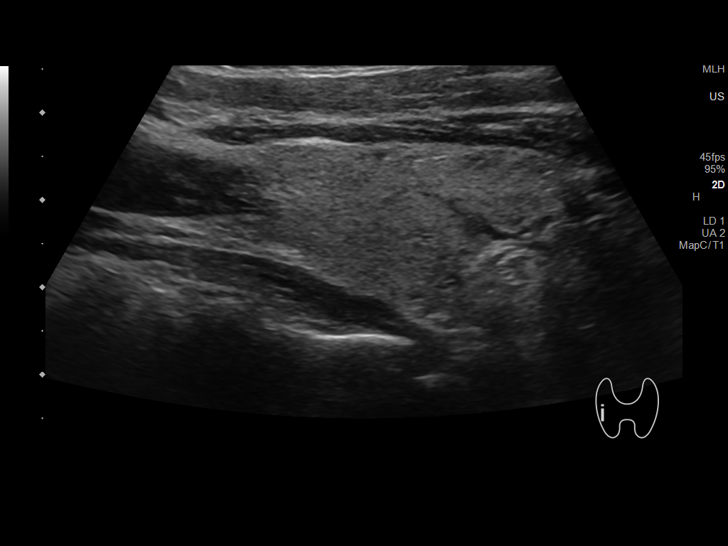
[im 9/25]
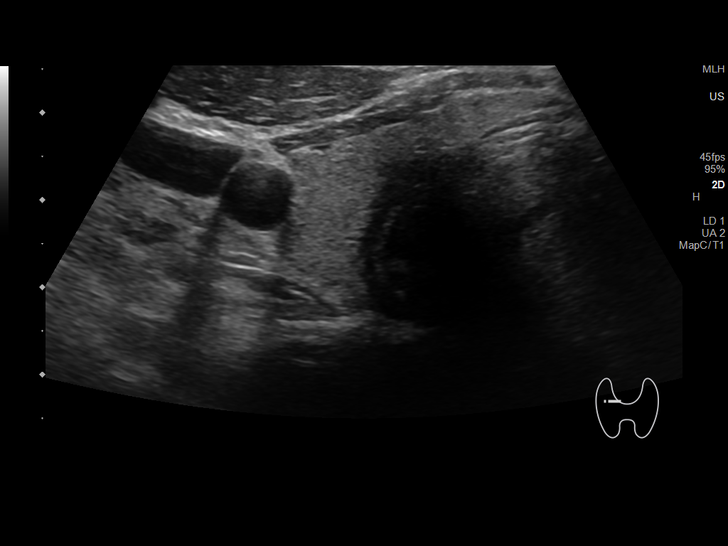
[im 10/25]
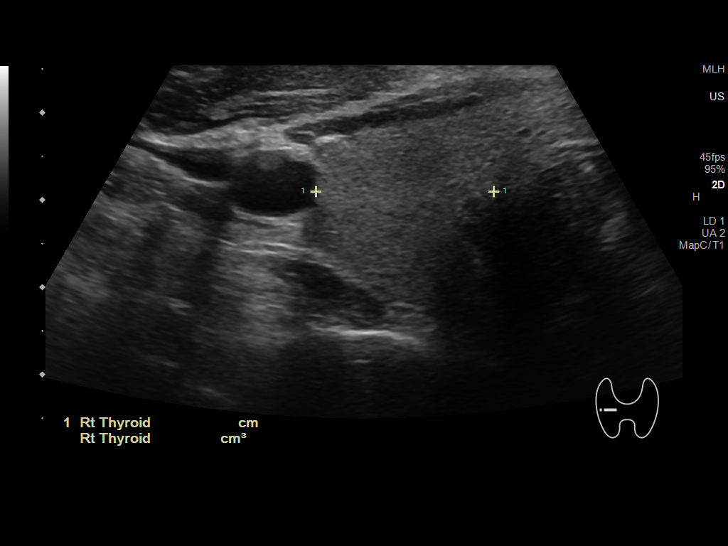
[im 12/25]
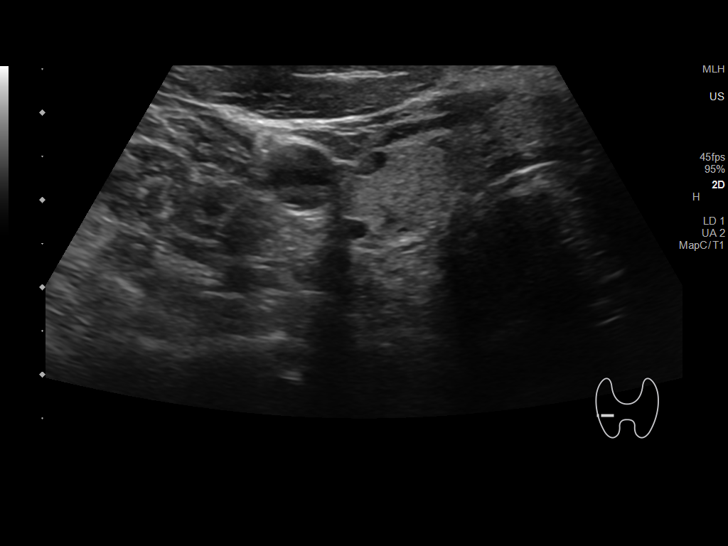
[im 14/25]
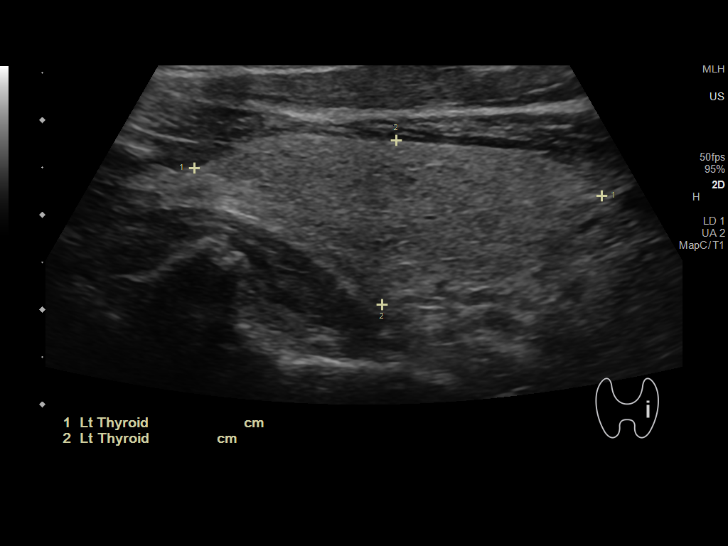
[im 16/25]
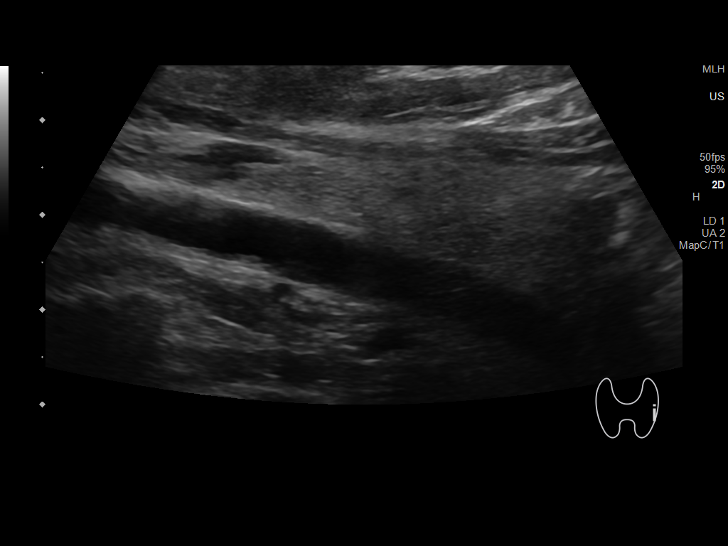
[im 17/25]
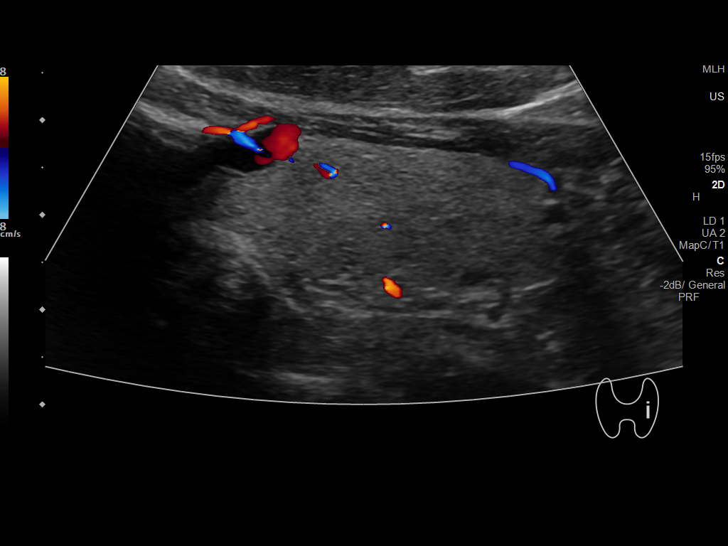
[im 19/25]
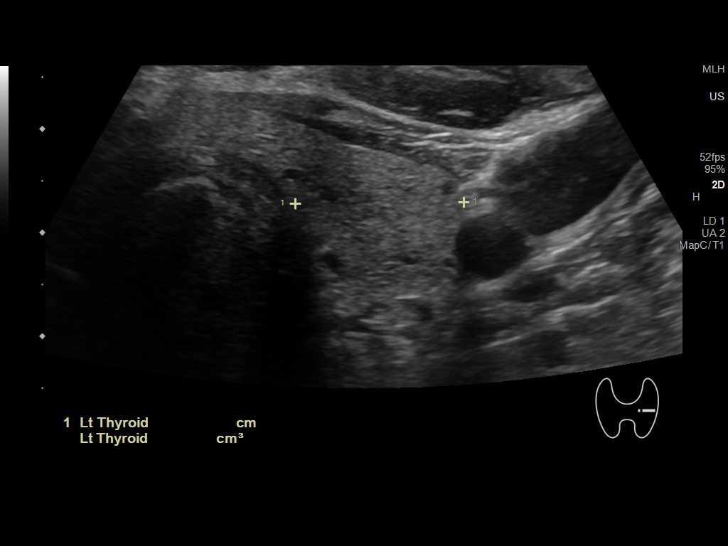
[im 21/25]
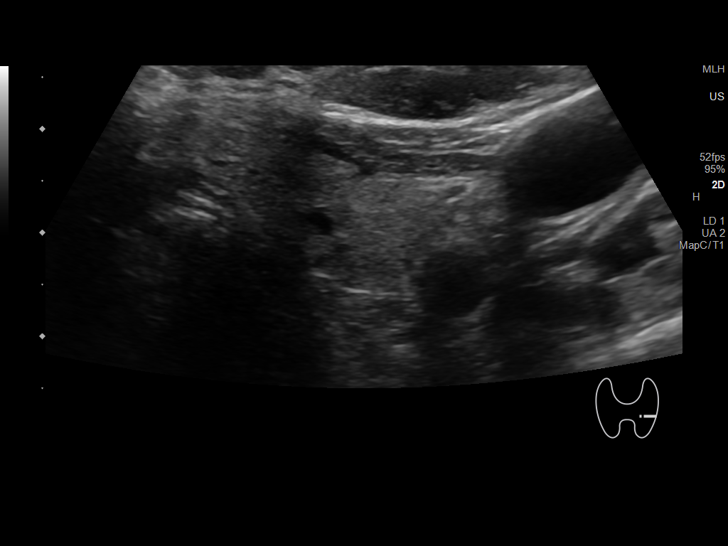
[im 23/25]
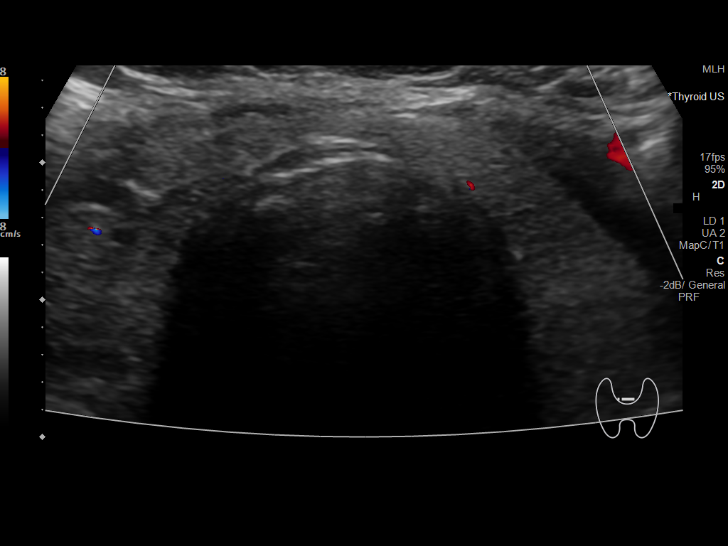
[im 25/25]
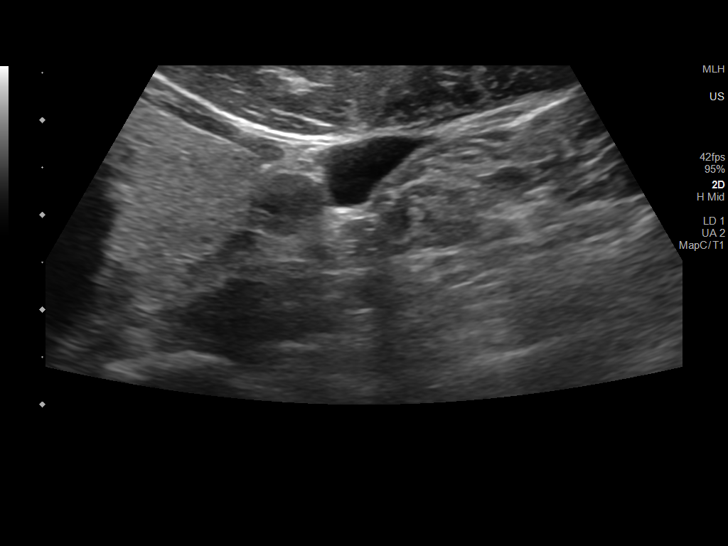

[14 of 25 positions shown; findings below may reference images not displayed]

FINDINGS: Parenchymal Echotexture: Normal

Isthmus: 0.2 cm

Right lobe: 4.4 x 2.0 x 2.0 cm

Left lobe: 4.3 x 1.7 x 1.6 cm

_________________________________________________________

Estimated total number of nodules >/= 1 cm: 0

Number of spongiform nodules >/=  2 cm not described below (TR1): 0

Number of mixed cystic and solid nodules >/= 1.5 cm not described
below (TR2): 0

_________________________________________________________

No discrete nodules are seen within the thyroid gland.
IMPRESSION: Normal thyroid ultrasound

The above is in keeping with the ACR TI-RADS recommendations - [HOSPITAL] 6096;[DATE].

## 2023-08-13 ENCOUNTER — Ambulatory Visit: Payer: Self-pay

## 2023-08-13 NOTE — Telephone Encounter (Signed)
 Calling to schedule annual physical. No triage needed.  Reason for Disposition . Requesting regular office appointment  Protocols used: Information Only Call - No Triage-A-AH

## 2023-09-10 ENCOUNTER — Ambulatory Visit (INDEPENDENT_AMBULATORY_CARE_PROVIDER_SITE_OTHER): Admitting: Medical

## 2023-09-10 ENCOUNTER — Encounter: Payer: Self-pay | Admitting: Medical

## 2023-09-10 VITALS — BP 117/68 | HR 61 | Temp 97.8°F | Resp 12 | Ht 68.0 in | Wt 186.6 lb

## 2023-09-10 DIAGNOSIS — Z13220 Encounter for screening for lipoid disorders: Secondary | ICD-10-CM

## 2023-09-10 DIAGNOSIS — Z1283 Encounter for screening for malignant neoplasm of skin: Secondary | ICD-10-CM

## 2023-09-10 DIAGNOSIS — R739 Hyperglycemia, unspecified: Secondary | ICD-10-CM

## 2023-09-10 DIAGNOSIS — Z0000 Encounter for general adult medical examination without abnormal findings: Secondary | ICD-10-CM | POA: Diagnosis not present

## 2023-09-10 DIAGNOSIS — Z23 Encounter for immunization: Secondary | ICD-10-CM | POA: Diagnosis not present

## 2023-09-10 DIAGNOSIS — L989 Disorder of the skin and subcutaneous tissue, unspecified: Secondary | ICD-10-CM

## 2023-09-10 DIAGNOSIS — Z125 Encounter for screening for malignant neoplasm of prostate: Secondary | ICD-10-CM | POA: Diagnosis not present

## 2023-09-10 NOTE — Progress Notes (Signed)
 Subjective:    Patient ID: Michael Lewis, male    DOB: 12-21-1965, 58 y.o.   MRN: [id number]  HPI  Pt in for cpe. He not  fasting. He will go ahead and get lab today.   Pt works for [employer].  Pt not exercising regularly(walks at home and work). Pt non smoker. No alcohol use. Pt states healthy diet.    Has had 2 covid vaccines and 2 boosters.  Pt has not had shingrix vaccine. He is willing to get tetanus and shingrix.    Up to date on colonoscopy.    Review of Systems  Constitutional:  Negative for chills, fatigue and fever.  HENT:  Negative for congestion and ear discharge.   Respiratory:  Negative for cough, chest tightness and wheezing.   Cardiovascular:  Negative for chest pain and palpitations.  Gastrointestinal:  Negative for abdominal distention, abdominal pain and vomiting.  Musculoskeletal:  Negative for back pain, myalgias and neck stiffness.  Skin:  Negative for rash.  Neurological:  Negative for dizziness, syncope, weakness and numbness.  Hematological:  Does not bruise/bleed easily.  Psychiatric/Behavioral:  Negative for agitation and decreased concentration. The patient is not nervous/anxious.     Past Medical History:  Diagnosis Date   Asthma    as a child, no problems as adult, no inhaler   Hematuria 11/26/2013   Seasonal allergies      Social History   Socioeconomic History   Marital status: Married    Spouse name: Not on file   Number of children: Not on file   Years of education: Not on file   Highest education level: Not on file  Occupational History   Occupation: Driver/Warehouse  Tobacco Use   Smoking status: Former    Current packs/day: 0.00    Average packs/day: 1 pack/day for 25.0 years (25.0 ttl pk-yrs)    Types: Cigarettes    Start date: 01/08/1983    Quit date: 01/08/2008    Years since quitting: 15.6   Smokeless tobacco: Never  Vaping Use   Vaping status: Never Used  Substance and Sexual Activity   Alcohol  use: No    Alcohol/week: 0.0 standard drinks of alcohol   Drug use: No   Sexual activity: Yes  Other Topics Concern   Not on file  Social History Narrative   Married. Education: [employer]. Exercise: No.   Social Drivers of Corporate investment banker Strain: Not on file  Food Insecurity: Not on file  Transportation Needs: Not on file  Physical Activity: Not on file  Stress: Not on file  Social Connections: Not on file  Intimate Partner Violence: Not on file    Past Surgical History:  Procedure Laterality Date   COLONOSCOPY  2021   MS-MAC-suprep(exc)-TA/SSP   NASAL SEPTUM SURGERY     x 2   TONSILLECTOMY     WISDOM TOOTH EXTRACTION      Family History  Problem Relation Age of Onset   COPD Father    Stroke Maternal Aunt        MGA   Heart disease Maternal Grandmother    Alzheimer's disease Maternal Grandfather    Arthritis Paternal Grandmother    Colon cancer Neg Hx    Rectal cancer Neg Hx    Stomach cancer Neg Hx    Esophageal cancer Neg Hx    Colon polyps Neg Hx     No Known Allergies  No current outpatient medications on file prior to  visit.   No current facility-administered medications on file prior to visit.    BP 117/68 (BP Location: Right Arm, Patient Position: Sitting, Cuff Size: Normal)   Pulse 61   Temp 97.8 F (36.6 C) (Oral)   Resp 12   Ht 5\' 8"  (1.727 m)   Wt 186 lb 9.6 oz (84.6 kg)   SpO2 98%   BMI 28.37 kg/m        Objective:   Physical Exam  General Mental Status- Alert. General Appearance- Not in acute distress.   Skin General: Color- Normal Color. Moisture- Normal Moisture.  Neck Carotid Arteries- Normal color. Moisture- Normal Moisture. No carotid bruits. No JVD.  Chest and Lung Exam Auscultation: Breath Sounds:-Normal.  Cardiovascular Auscultation:Rythm- Regular. Murmurs & Other Heart Sounds:Auscultation of the heart reveals- No Murmurs.  Abdomen Inspection:-Inspeection Normal. Palpation/Percussion:Note:No  mass. Palpation and Percussion of the abdomen reveal- Non Tender, Non Distended + BS, no rebound or guarding.   Neurologic Cranial Nerve exam:- CN III-XII intact(No nystagmus), symmetric smile. Strength:- 5/5 equal and symmetric strength both upper and lower extremities.       Assessment & Plan:   Patient Instructions  For you wellness exam today I have ordered cbc, cmp, psa and  lipid panel.  Vaccine given today tetanus and shingrix  Recommend exercise and healthy diet.  We will let you know lab results as they come in.  Follow up date appointment will be determined after lab review.       Cardale Dorer, PA-C

## 2023-09-10 NOTE — Addendum Note (Signed)
 Addended by: Marylou Sobers D on: 09/10/2023 03:56 PM   Modules accepted: Orders

## 2023-09-10 NOTE — Patient Instructions (Addendum)
 For you wellness exam today I have ordered cbc, cmp, psa and  lipid panel.  Vaccine given today tetanus and shingrix  Recommend exercise and healthy diet.  We will let you know lab results as they come in.  Follow up date appointment will be determined after lab review.    Preventive Care 56-56 Years Old, Male Preventive care refers to lifestyle choices and visits with your health care provider that can promote health and wellness. Preventive care visits are also called wellness exams. What can I expect for my preventive care visit? Counseling During your preventive care visit, your health care provider may ask about your: Medical history, including: Past medical problems. Family medical history. Current health, including: Emotional well-being. Home life and relationship well-being. Sexual activity. Lifestyle, including: Alcohol, nicotine or tobacco, and drug use. Access to firearms. Diet, exercise, and sleep habits. Safety issues such as seatbelt and bike helmet use. Sunscreen use. Work and work Astronomer. Physical exam Your health care provider will check your: Height and weight. These may be used to calculate your BMI (body mass index). BMI is a measurement that tells if you are at a healthy weight. Waist circumference. This measures the distance around your waistline. This measurement also tells if you are at a healthy weight and may help predict your risk of certain diseases, such as type 2 diabetes and high blood pressure. Heart rate and blood pressure. Body temperature. Skin for abnormal spots. What immunizations do I need?  Vaccines are usually given at various ages, according to a schedule. Your health care provider will recommend vaccines for you based on your age, medical history, and lifestyle or other factors, such as travel or where you work. What tests do I need? Screening Your health care provider may recommend screening tests for certain conditions. This  may include: Lipid and cholesterol levels. Diabetes screening. This is done by checking your blood sugar (glucose) after you have not eaten for a while (fasting). Hepatitis B test. Hepatitis C test. HIV (human immunodeficiency virus) test. STI (sexually transmitted infection) testing, if you are at risk. Lung cancer screening. Prostate cancer screening. Colorectal cancer screening. Talk with your health care provider about your test results, treatment options, and if necessary, the need for more tests. Follow these instructions at home: Eating and drinking  Eat a diet that includes fresh fruits and vegetables, whole grains, lean protein, and low-fat dairy products. Take vitamin and mineral supplements as recommended by your health care provider. Do not drink alcohol if your health care provider tells you not to drink. If you drink alcohol: Limit how much you have to 0-2 drinks a day. Know how much alcohol is in your drink. In the [place] one drink equals one 12 oz bottle of beer (355 mL), one 5 oz glass of wine (148 mL), or one 1 oz glass of hard liquor (44 mL). Lifestyle Brush your teeth every morning and night with fluoride toothpaste. Floss one time each day. Exercise for at least 30 minutes 5 or more days each week. Do not use any products that contain nicotine or tobacco. These products include cigarettes, chewing tobacco, and vaping devices, such as e-cigarettes. If you need help quitting, ask your health care provider. Do not use drugs. If you are sexually active, practice safe sex. Use a condom or other form of protection to prevent STIs. Take aspirin only as told by your health care provider. Make sure that you understand how much to take and what form to take.  Work with your health care provider to find out whether it is safe and beneficial for you to take aspirin daily. Find healthy ways to manage stress, such as: Meditation, yoga, or listening to music. Journaling. Talking to  a trusted person. Spending time with friends and family. Minimize exposure to UV radiation to reduce your risk of skin cancer. Safety Always wear your seat belt while driving or riding in a vehicle. Do not drive: If you have been drinking alcohol. Do not ride with someone who has been drinking. When you are tired or distracted. While texting. If you have been using any mind-altering substances or drugs. Wear a helmet and other protective equipment during sports activities. If you have firearms in your house, make sure you follow all gun safety procedures. What's next? Go to your health care provider once a year for an annual wellness visit. Ask your health care provider how often you should have your eyes and teeth checked. Stay up to date on all vaccines. This information is not intended to replace advice given to you by your health care provider. Make sure you discuss any questions you have with your health care provider. Document Revised: 09/22/2020 Document Reviewed: 09/22/2020 Elsevier Patient Education  2024 [employer].

## 2023-09-11 ENCOUNTER — Ambulatory Visit: Payer: Self-pay | Admitting: Medical

## 2023-09-11 LAB — COMPREHENSIVE METABOLIC PANEL WITH GFR
ALT: 19 U/L (ref 0–53)
AST: 18 U/L (ref 0–37)
Albumin: 4.4 g/dL (ref 3.5–5.2)
Alkaline Phosphatase: 74 U/L (ref 39–117)
BUN: 15 mg/dL (ref 6–23)
CO2: 27 meq/L (ref 19–32)
Calcium: 9.4 mg/dL (ref 8.4–10.5)
Chloride: 105 meq/L (ref 96–112)
Creatinine, Ser: 1.05 mg/dL (ref 0.40–1.50)
GFR: 79.49 mL/min (ref >60.00)
Glucose, Bld: 116 mg/dL — ABNORMAL HIGH (ref 70–99)
Potassium: 4 meq/L (ref 3.5–5.1)
Sodium: 139 meq/L (ref 135–145)
Total Bilirubin: 0.6 mg/dL (ref 0.2–1.2)
Total Protein: 6.7 g/dL (ref 6.0–8.3)

## 2023-09-11 LAB — CBC WITH DIFFERENTIAL/PLATELET
Basophils Absolute: 0.1 10*3/uL (ref 0.0–0.1)
Basophils Relative: 1.2 % (ref 0.0–3.0)
Eosinophils Absolute: 0.2 10*3/uL (ref 0.0–0.7)
Eosinophils Relative: 2.9 % (ref 0.0–5.0)
HCT: 43.5 % (ref 39.0–52.0)
Hemoglobin: 14.8 g/dL (ref 13.0–17.0)
Lymphocytes Relative: 32.8 % (ref 12.0–46.0)
Lymphs Abs: 2.2 10*3/uL (ref 0.7–4.0)
MCHC: 33.9 g/dL (ref 30.0–36.0)
MCV: 92.5 fl (ref 78.0–100.0)
Monocytes Absolute: 0.7 10*3/uL (ref 0.1–1.0)
Monocytes Relative: 9.9 % (ref 3.0–12.0)
Neutro Abs: 3.7 10*3/uL (ref 1.4–7.7)
Neutrophils Relative %: 53.2 % (ref 43.0–77.0)
Platelets: 349 10*3/uL (ref 150.0–400.0)
RBC: 4.71 Mil/uL (ref 4.22–5.81)
RDW: 12.8 % (ref 11.5–15.5)
WBC: 6.9 10*3/uL (ref 4.0–10.5)

## 2023-09-11 LAB — LIPID PANEL
Cholesterol: 171 mg/dL (ref 0–200)
HDL: 54.3 mg/dL (ref >39.00)
LDL Cholesterol: 94 mg/dL (ref 0–99)
NonHDL: 116.74
Total CHOL/HDL Ratio: 3
Triglycerides: 116 mg/dL (ref 0.0–149.0)
VLDL: 23.2 mg/dL (ref 0.0–40.0)

## 2023-09-11 LAB — PSA: PSA: 0.66 ng/mL (ref 0.10–4.00)

## 2023-09-11 NOTE — Addendum Note (Signed)
 Addended by: Serafina Damme on: 09/11/2023 11:40 AM   Modules accepted: Orders

## 2023-09-27 ENCOUNTER — Other Ambulatory Visit (HOSPITAL_COMMUNITY): Payer: Self-pay
